# Patient Record
Sex: Male | Born: 1960
Health system: Southern US, Community
[De-identification: ages and names within clinical notes are randomized; demographics above are authoritative.]

## PROBLEM LIST (undated history)

## (undated) DIAGNOSIS — J302 Other seasonal allergic rhinitis: Secondary | ICD-10-CM

## (undated) DIAGNOSIS — I4891 Unspecified atrial fibrillation: Secondary | ICD-10-CM

## (undated) DIAGNOSIS — N289 Disorder of kidney and ureter, unspecified: Secondary | ICD-10-CM

## (undated) DIAGNOSIS — I429 Cardiomyopathy, unspecified: Secondary | ICD-10-CM

## (undated) DIAGNOSIS — L309 Dermatitis, unspecified: Secondary | ICD-10-CM

## (undated) HISTORY — DX: Unspecified atrial fibrillation: I48.91

## (undated) HISTORY — DX: Other seasonal allergic rhinitis: J30.2

## (undated) HISTORY — DX: Cardiomyopathy, unspecified: I42.9

## (undated) HISTORY — PX: CATARACT EXTRACTION: SUR2

## (undated) HISTORY — DX: Dermatitis, unspecified: L30.9

---

## 2001-03-15 ENCOUNTER — Emergency Department (HOSPITAL_COMMUNITY): Admission: EM | Admit: 2001-03-15 | Discharge: 2001-03-15 | Payer: Self-pay

## 2002-02-13 ENCOUNTER — Ambulatory Visit (HOSPITAL_BASED_OUTPATIENT_CLINIC_OR_DEPARTMENT_OTHER): Admission: RE | Admit: 2002-02-13 | Discharge: 2002-02-13 | Payer: Self-pay | Admitting: Otolaryngology

## 2002-12-31 ENCOUNTER — Ambulatory Visit (HOSPITAL_COMMUNITY): Admission: RE | Admit: 2002-12-31 | Discharge: 2002-12-31 | Payer: Self-pay | Admitting: Gastroenterology

## 2005-08-21 ENCOUNTER — Encounter: Admission: RE | Admit: 2005-08-21 | Discharge: 2005-11-19 | Payer: Self-pay | Admitting: Otolaryngology

## 2006-01-28 ENCOUNTER — Encounter: Admission: RE | Admit: 2006-01-28 | Discharge: 2006-01-28 | Payer: Self-pay | Admitting: Gastroenterology

## 2006-02-21 ENCOUNTER — Encounter: Admission: RE | Admit: 2006-02-21 | Discharge: 2006-02-21 | Payer: Self-pay | Admitting: Neurology

## 2008-05-25 ENCOUNTER — Encounter: Admission: RE | Admit: 2008-05-25 | Discharge: 2008-05-25 | Payer: Self-pay | Admitting: Gastroenterology

## 2008-05-31 ENCOUNTER — Encounter: Admission: RE | Admit: 2008-05-31 | Discharge: 2008-05-31 | Payer: Self-pay | Admitting: Gastroenterology

## 2008-11-15 ENCOUNTER — Encounter: Admission: RE | Admit: 2008-11-15 | Discharge: 2008-11-15 | Payer: Self-pay | Admitting: Gastroenterology

## 2008-12-25 ENCOUNTER — Ambulatory Visit (HOSPITAL_COMMUNITY): Admission: RE | Admit: 2008-12-25 | Discharge: 2008-12-25 | Payer: Self-pay | Admitting: Urology

## 2009-06-12 ENCOUNTER — Ambulatory Visit (HOSPITAL_COMMUNITY): Admission: RE | Admit: 2009-06-12 | Discharge: 2009-06-12 | Payer: Self-pay | Admitting: Urology

## 2009-11-28 ENCOUNTER — Ambulatory Visit (HOSPITAL_COMMUNITY): Admission: RE | Admit: 2009-11-28 | Discharge: 2009-11-28 | Payer: Self-pay | Admitting: Urology

## 2010-08-21 ENCOUNTER — Ambulatory Visit: Payer: Self-pay | Admitting: Ophthalmology

## 2010-10-05 NOTE — Op Note (Signed)
   NAME:  Dylan Wilkerson, Dylan Wilkerson                       ACCOUNT NO.:  [id number]   MEDICAL RECORD NO.:  [id number]                   PATIENT TYPE:  AMB   LOCATION:  ENDO                                 FACILITY:  [facility]   PHYSICIAN:  Danise Edge, M.D.                DATE OF BIRTH:  Jan 19, 1961   DATE OF PROCEDURE:  12/31/2002  DATE OF DISCHARGE:                                 OPERATIVE REPORT   PROCEDURE:  Screening colonoscopy.   INDICATIONS:  [name] is a 50 year old male, born March 15, 1961.  [name] 37 year old mother underwent a colonoscopy at [facility] and a neoplastic polyp was removed.  He has a grandparent  diagnosed with colon cancer years ago.   ENDOSCOPIST:  Danise Edge, M.D.   PREMEDICATION:  Demerol 100 mg, Versed 8 mg.   DESCRIPTION OF PROCEDURE:  After obtaining informed consent, [name]  was placed in the left lateral decubitus position.  I administered  intravenous Demerol and intravenous Versed to achieve conscious sedation for  the procedure.  The patient's blood pressure, oxygen saturation and cardiac  rhythm were monitored throughout the procedure and documented in the medical  record.   Anal inspection was normal.  Digital rectal exam was normal.  The prostate  was nonnodular and small.  The Olympus adult colonoscope was introduced into  the rectum and advanced to the cecum.  Colonic preparation for the exam  today was excellent.  Rectum:  Normal.  Sigmoid colon and descending colon:  Normal.  Splenic flexure:  Normal.  Transverse colon:  Normal.  Hepatic flexure:  Normal.  Ascending colon:  Normal.  Cecum and ileocecal valve:  Normal.   ASSESSMENT:  Normal screening proctocolonoscopy to the cecum.  No endoscopic  evidence for the presence of colorectal neoplasia.   RECOMMENDATIONS:  Repeat colonoscopy in 10 years.                                               Danise Edge, M.D.    MJ/MEDQ  D:  12/31/2002   T:  01/01/2003  Job:  [id number]

## 2010-12-03 ENCOUNTER — Other Ambulatory Visit (HOSPITAL_COMMUNITY): Payer: Self-pay | Admitting: Urology

## 2010-12-03 DIAGNOSIS — D499 Neoplasm of unspecified behavior of unspecified site: Secondary | ICD-10-CM

## 2011-01-11 ENCOUNTER — Ambulatory Visit (HOSPITAL_COMMUNITY)
Admission: RE | Admit: 2011-01-11 | Discharge: 2011-01-11 | Disposition: A | Discharge status: Home / Self Care | Payer: 59 | Source: Ambulatory Visit | Attending: Urology | Admitting: Urology

## 2011-01-11 DIAGNOSIS — N289 Disorder of kidney and ureter, unspecified: Secondary | ICD-10-CM | POA: Insufficient documentation

## 2011-01-11 DIAGNOSIS — D499 Neoplasm of unspecified behavior of unspecified site: Secondary | ICD-10-CM

## 2011-01-11 DIAGNOSIS — N281 Cyst of kidney, acquired: Secondary | ICD-10-CM | POA: Insufficient documentation

## 2011-01-11 MED ORDER — GADOBENATE DIMEGLUMINE 529 MG/ML IV SOLN
17.0000 mL | Freq: Once | INTRAVENOUS | Status: AC | PRN
  Administered 2011-01-11: 17 mL via INTRAVENOUS

## 2011-04-19 ENCOUNTER — Ambulatory Visit: Payer: Self-pay | Admitting: Ophthalmology

## 2012-01-01 ENCOUNTER — Other Ambulatory Visit (HOSPITAL_COMMUNITY): Payer: Self-pay | Admitting: Urology

## 2012-01-01 DIAGNOSIS — D49519 Neoplasm of unspecified behavior of unspecified kidney: Secondary | ICD-10-CM

## 2012-01-22 ENCOUNTER — Ambulatory Visit (HOSPITAL_COMMUNITY)
Admission: RE | Admit: 2012-01-22 | Discharge: 2012-01-22 | Disposition: A | Discharge status: Home / Self Care | Payer: 59 | Source: Ambulatory Visit | Attending: Urology | Admitting: Urology

## 2012-01-22 DIAGNOSIS — D4959 Neoplasm of unspecified behavior of other genitourinary organ: Secondary | ICD-10-CM | POA: Insufficient documentation

## 2012-01-22 DIAGNOSIS — D49519 Neoplasm of unspecified behavior of unspecified kidney: Secondary | ICD-10-CM

## 2012-01-22 DIAGNOSIS — N289 Disorder of kidney and ureter, unspecified: Secondary | ICD-10-CM | POA: Insufficient documentation

## 2012-01-22 MED ORDER — GADOBENATE DIMEGLUMINE 529 MG/ML IV SOLN
17.0000 mL | Freq: Once | INTRAVENOUS | Status: AC | PRN
  Administered 2012-01-22: 17 mL via INTRAVENOUS

## 2014-01-12 ENCOUNTER — Other Ambulatory Visit: Payer: Self-pay | Admitting: Urology

## 2014-01-12 DIAGNOSIS — D49519 Neoplasm of unspecified behavior of unspecified kidney: Secondary | ICD-10-CM

## 2014-01-28 ENCOUNTER — Ambulatory Visit (HOSPITAL_COMMUNITY)
Admission: RE | Admit: 2014-01-28 | Discharge: 2014-01-28 | Disposition: A | Discharge status: Home / Self Care | Payer: 59 | Source: Ambulatory Visit | Attending: Urology | Admitting: Urology

## 2014-01-28 ENCOUNTER — Other Ambulatory Visit (HOSPITAL_COMMUNITY): Payer: Self-pay | Admitting: Urology

## 2014-01-28 DIAGNOSIS — D4959 Neoplasm of unspecified behavior of other genitourinary organ: Secondary | ICD-10-CM | POA: Insufficient documentation

## 2014-01-28 DIAGNOSIS — D49519 Neoplasm of unspecified behavior of unspecified kidney: Secondary | ICD-10-CM

## 2014-01-28 MED ORDER — GADOBENATE DIMEGLUMINE 529 MG/ML IV SOLN
20.0000 mL | Freq: Once | INTRAVENOUS | Status: AC | PRN
  Administered 2014-01-28: 20 mL via INTRAVENOUS

## 2014-05-02 ENCOUNTER — Other Ambulatory Visit: Payer: Self-pay | Admitting: Gastroenterology

## 2014-05-02 DIAGNOSIS — R1012 Left upper quadrant pain: Secondary | ICD-10-CM

## 2014-05-04 ENCOUNTER — Ambulatory Visit
Admission: RE | Admit: 2014-05-04 | Discharge: 2014-05-04 | Disposition: A | Discharge status: Home / Self Care | Payer: 59 | Source: Ambulatory Visit | Attending: Gastroenterology | Admitting: Gastroenterology

## 2014-05-04 DIAGNOSIS — R1012 Left upper quadrant pain: Secondary | ICD-10-CM

## 2014-05-05 ENCOUNTER — Ambulatory Visit
Admission: RE | Admit: 2014-05-05 | Discharge: 2014-05-05 | Disposition: A | Discharge status: Home / Self Care | Payer: 59 | Source: Ambulatory Visit | Attending: Gastroenterology | Admitting: Gastroenterology

## 2014-05-05 ENCOUNTER — Other Ambulatory Visit: Payer: Self-pay | Admitting: Gastroenterology

## 2014-05-05 DIAGNOSIS — R0789 Other chest pain: Secondary | ICD-10-CM

## 2015-03-05 ENCOUNTER — Emergency Department (HOSPITAL_COMMUNITY)
Admission: EM | Admit: 2015-03-05 | Discharge: 2015-03-05 | Disposition: A | Discharge status: Home / Self Care | Payer: 59 | Attending: Emergency Medicine | Admitting: Emergency Medicine

## 2015-03-05 ENCOUNTER — Emergency Department (HOSPITAL_COMMUNITY): Payer: 59

## 2015-03-05 ENCOUNTER — Encounter (HOSPITAL_COMMUNITY): Payer: Self-pay | Admitting: Emergency Medicine

## 2015-03-05 DIAGNOSIS — X58XXXA Exposure to other specified factors, initial encounter: Secondary | ICD-10-CM | POA: Insufficient documentation

## 2015-03-05 DIAGNOSIS — S4992XA Unspecified injury of left shoulder and upper arm, initial encounter: Secondary | ICD-10-CM | POA: Diagnosis present

## 2015-03-05 DIAGNOSIS — Y9289 Other specified places as the place of occurrence of the external cause: Secondary | ICD-10-CM | POA: Insufficient documentation

## 2015-03-05 DIAGNOSIS — Y9389 Activity, other specified: Secondary | ICD-10-CM | POA: Diagnosis not present

## 2015-03-05 DIAGNOSIS — S46011A Strain of muscle(s) and tendon(s) of the rotator cuff of right shoulder, initial encounter: Secondary | ICD-10-CM | POA: Diagnosis not present

## 2015-03-05 DIAGNOSIS — T148XXA Other injury of unspecified body region, initial encounter: Secondary | ICD-10-CM

## 2015-03-05 DIAGNOSIS — Z87448 Personal history of other diseases of urinary system: Secondary | ICD-10-CM | POA: Insufficient documentation

## 2015-03-05 DIAGNOSIS — S299XXA Unspecified injury of thorax, initial encounter: Secondary | ICD-10-CM | POA: Diagnosis not present

## 2015-03-05 DIAGNOSIS — Y998 Other external cause status: Secondary | ICD-10-CM | POA: Diagnosis not present

## 2015-03-05 HISTORY — DX: Disorder of kidney and ureter, unspecified: N28.9

## 2015-03-05 MED ORDER — HYDROCODONE-ACETAMINOPHEN 5-325 MG PO TABS
1.0000 | ORAL_TABLET | Freq: Once | ORAL | Status: AC
  Administered 2015-03-05: 1 via ORAL
  Filled 2015-03-05: qty 1

## 2015-03-05 MED ORDER — DIAZEPAM 5 MG PO TABS: 5.0000 mg | ORAL_TABLET | Freq: Three times a day (TID) | ORAL | Status: DC | PRN

## 2015-03-05 MED ORDER — HYDROCODONE-ACETAMINOPHEN 5-325 MG PO TABS: 1.0000 | ORAL_TABLET | Freq: Four times a day (QID) | ORAL | Status: DC | PRN

## 2015-03-05 MED ORDER — DIAZEPAM 5 MG PO TABS
5.0000 mg | ORAL_TABLET | Freq: Once | ORAL | Status: AC
  Administered 2015-03-05: 5 mg via ORAL
  Filled 2015-03-05: qty 1

## 2015-03-05 NOTE — ED Notes (Signed)
Per EMS pt has had increasing shoulder pain that has increased to the point he was unable to sleep tonight.  Pain while still is a 2 however upon movement 10.

## 2015-03-05 NOTE — ED Provider Notes (Signed)
CSN: [id number]     Arrival date & time    History  By signing my name below, I, Terrance Branch, attest that this documentation has been prepared under the direction and in the presence of No att. providers found. Electronically Signed: Randa Evens, ED Scribe. 03/05/2015. 7:35 AM.     Chief Complaint  Patient presents with  . Shoulder Pain   The history is provided by the patient. No language interpreter was used.   HPI Comments: Dylan Wilkerson is a 54 y.o. male brought in by ambulance, who presents to the Emergency Department complaining of worsening sharp-stabbing back pain onset 3 days prior. Pt states that the pain was so severe tonight that he could not sleep. Pt states that the pain begins in his right upper back and radiates down to his right hip.  Pt states that breathing, movement and laying down makes the pain worse. Pt states that turning his head to the left makes the pain worse as well. Pt denies any medications PTA. Pt denies any heavy lifting or injury to the back  Pt does he does have HX of 2 right kidney lesions. Pt denies rash, CP, abdominal pain or other related symptoms.    Past Medical History  Diagnosis Date  . Kidney lesion     "has been cleared"   History reviewed. No pertinent past surgical history. No family history on file. Social History  Substance Use Topics  . Smoking status: Never Smoker   . Smokeless tobacco: None  . Alcohol Use: No    Review of Systems  Cardiovascular: Negative for chest pain.  Gastrointestinal: Negative for abdominal pain.  Genitourinary: Negative.   Musculoskeletal: Positive for myalgias and back pain.  Skin: Negative for rash.  All other systems reviewed and are negative.     Allergies  Dairy aid  Home Medications   Prior to Admission medications   Medication Sig Start Date End Date Taking? Authorizing Provider  diazepam (VALIUM) 5 MG tablet Take 1 tablet (5 mg total) by mouth every 8 (eight) hours as needed  for muscle spasms. 03/05/15   Merrily Pew, MD  HYDROcodone-acetaminophen (NORCO/VICODIN) 5-325 MG tablet Take 1 tablet by mouth every 6 (six) hours as needed for moderate pain. 03/05/15   Philomene Haff, MD   BP 134/85 mmHg  Pulse 79  Temp(Src) 98.3 F (36.8 C) (Oral)  Resp 18  SpO2 97%   Physical Exam  Constitutional: He is oriented to person, place, and time. He appears well-developed and well-nourished. No distress.  HENT:  Head: Normocephalic and atraumatic.  Eyes: Conjunctivae and EOM are normal.  Neck: Neck supple. No tracheal deviation present.  Cardiovascular: Normal rate, regular rhythm and normal heart sounds.  Exam reveals no gallop.   No murmur heard. Pulmonary/Chest: Effort normal and breath sounds normal. No respiratory distress. He has no wheezes. He has no rales.  Abdominal: Soft. There is no tenderness.  Musculoskeletal: Normal range of motion. He exhibits tenderness.  Right trapezius tender to palpation. Movement of right trapezius exacerbates pain.   Neurological: He is alert and oriented to person, place, and time. No cranial nerve deficit.  Skin: Skin is warm and dry.  Psychiatric: He has a normal mood and affect. His behavior is normal.  Nursing note and vitals reviewed.   ED Course  Procedures (including critical care time) DIAGNOSTIC STUDIES: Oxygen Saturation is 100% on RA, normal by my interpretation.    COORDINATION OF CARE: 2:48 AM-Discussed treatment plan with pt at  bedside and pt agreed to plan.     Labs Review Labs Reviewed - No data to display  Imaging Review Dg Chest 2 View  03/05/2015  CLINICAL DATA:  Initial valuation for acute back pain for 3 days. EXAM: CHEST  2 VIEW COMPARISON:  Prior radiograph from 05/05/2014. FINDINGS: The cardiac and mediastinal silhouettes are stable in size and contour, and remain within normal limits. The lungs are normally inflated. No airspace consolidation, pleural effusion, or pulmonary edema is identified.  There is no pneumothorax. No acute osseous abnormality identified. IMPRESSION: No active cardiopulmonary disease. Electronically Signed   By: Jeannine Boga M.D.   On: 03/05/2015 03:54      EKG Interpretation None      MDM   Final diagnoses:  Muscle strain   Here with likely previous strain and spasm improved with symptomatic treatment in the ED, will dc on same. Doubt vertebral dissection or pneumonia.    I have personally and contemperaneously reviewed labs and imaging and used in my decision making as above.   A medical screening exam was performed and I feel the patient has had an appropriate workup for their chief complaint at this time and likelihood of emergent condition existing is low. They have been counseled on decision, discharge, follow up and which symptoms necessitate immediate return to the emergency department. They or their family verbally stated understanding and agreement with plan and discharged in stable condition.   I personally performed the services described in this documentation, which was scribed in my presence. The recorded information has been reviewed and is accurate.      Merrily Pew, MD 03/05/15 [phone]

## 2015-03-05 NOTE — ED Notes (Signed)
Pt placed in a gown and hooked up to the monitor with the BP cuff and pulse ox 

## 2015-03-05 NOTE — ED Notes (Signed)
Discharge instructions and prescriptions reviewed - voiced understanding 

## 2016-05-27 DIAGNOSIS — J329 Chronic sinusitis, unspecified: Secondary | ICD-10-CM | POA: Diagnosis not present

## 2016-08-14 DIAGNOSIS — H401132 Primary open-angle glaucoma, bilateral, moderate stage: Secondary | ICD-10-CM | POA: Diagnosis not present

## 2016-08-14 DIAGNOSIS — H5213 Myopia, bilateral: Secondary | ICD-10-CM | POA: Diagnosis not present

## 2016-08-14 DIAGNOSIS — Z961 Presence of intraocular lens: Secondary | ICD-10-CM | POA: Diagnosis not present

## 2016-08-28 DIAGNOSIS — H15833 Staphyloma posticum, bilateral: Secondary | ICD-10-CM | POA: Diagnosis not present

## 2016-08-28 DIAGNOSIS — H442A3 Degenerative myopia with choroidal neovascularization, bilateral eye: Secondary | ICD-10-CM | POA: Diagnosis not present

## 2016-12-18 DIAGNOSIS — S20451A Superficial foreign body of right back wall of thorax, initial encounter: Secondary | ICD-10-CM | POA: Diagnosis not present

## 2016-12-18 DIAGNOSIS — S2096XA Insect bite (nonvenomous) of unspecified parts of thorax, initial encounter: Secondary | ICD-10-CM | POA: Diagnosis not present

## 2017-01-29 DIAGNOSIS — H401132 Primary open-angle glaucoma, bilateral, moderate stage: Secondary | ICD-10-CM | POA: Diagnosis not present

## 2017-03-18 DIAGNOSIS — Z0000 Encounter for general adult medical examination without abnormal findings: Secondary | ICD-10-CM | POA: Diagnosis not present

## 2017-03-18 DIAGNOSIS — Z23 Encounter for immunization: Secondary | ICD-10-CM | POA: Diagnosis not present

## 2017-03-18 DIAGNOSIS — N281 Cyst of kidney, acquired: Secondary | ICD-10-CM | POA: Diagnosis not present

## 2017-03-18 DIAGNOSIS — Z125 Encounter for screening for malignant neoplasm of prostate: Secondary | ICD-10-CM | POA: Diagnosis not present

## 2017-03-18 DIAGNOSIS — E7800 Pure hypercholesterolemia, unspecified: Secondary | ICD-10-CM | POA: Diagnosis not present

## 2017-06-27 DIAGNOSIS — M25522 Pain in left elbow: Secondary | ICD-10-CM | POA: Diagnosis not present

## 2017-07-29 DIAGNOSIS — H401132 Primary open-angle glaucoma, bilateral, moderate stage: Secondary | ICD-10-CM | POA: Diagnosis not present

## 2017-07-29 DIAGNOSIS — Z961 Presence of intraocular lens: Secondary | ICD-10-CM | POA: Diagnosis not present

## 2017-07-29 DIAGNOSIS — H5213 Myopia, bilateral: Secondary | ICD-10-CM | POA: Diagnosis not present

## 2017-08-16 DIAGNOSIS — M25522 Pain in left elbow: Secondary | ICD-10-CM | POA: Diagnosis not present

## 2017-08-16 DIAGNOSIS — M7712 Lateral epicondylitis, left elbow: Secondary | ICD-10-CM | POA: Diagnosis not present

## 2017-08-21 DIAGNOSIS — M25522 Pain in left elbow: Secondary | ICD-10-CM | POA: Diagnosis not present

## 2017-08-28 DIAGNOSIS — M25522 Pain in left elbow: Secondary | ICD-10-CM | POA: Diagnosis not present

## 2017-09-03 DIAGNOSIS — H15833 Staphyloma posticum, bilateral: Secondary | ICD-10-CM | POA: Diagnosis not present

## 2017-09-03 DIAGNOSIS — H4423 Degenerative myopia, bilateral: Secondary | ICD-10-CM | POA: Diagnosis not present

## 2017-09-03 DIAGNOSIS — H43811 Vitreous degeneration, right eye: Secondary | ICD-10-CM | POA: Diagnosis not present

## 2017-09-04 DIAGNOSIS — M25522 Pain in left elbow: Secondary | ICD-10-CM | POA: Diagnosis not present

## 2017-09-18 DIAGNOSIS — M25522 Pain in left elbow: Secondary | ICD-10-CM | POA: Diagnosis not present

## 2017-09-26 DIAGNOSIS — M25522 Pain in left elbow: Secondary | ICD-10-CM | POA: Diagnosis not present

## 2017-10-08 DIAGNOSIS — M25522 Pain in left elbow: Secondary | ICD-10-CM | POA: Diagnosis not present

## 2017-11-17 DIAGNOSIS — H6692 Otitis media, unspecified, left ear: Secondary | ICD-10-CM | POA: Diagnosis not present

## 2017-11-22 DIAGNOSIS — H6980 Other specified disorders of Eustachian tube, unspecified ear: Secondary | ICD-10-CM | POA: Diagnosis not present

## 2017-12-02 DIAGNOSIS — H6521 Chronic serous otitis media, right ear: Secondary | ICD-10-CM | POA: Diagnosis not present

## 2017-12-02 DIAGNOSIS — H6061 Unspecified chronic otitis externa, right ear: Secondary | ICD-10-CM | POA: Diagnosis not present

## 2017-12-02 DIAGNOSIS — H6691 Otitis media, unspecified, right ear: Secondary | ICD-10-CM | POA: Diagnosis not present

## 2017-12-05 DIAGNOSIS — H6061 Unspecified chronic otitis externa, right ear: Secondary | ICD-10-CM | POA: Diagnosis not present

## 2017-12-05 DIAGNOSIS — H6691 Otitis media, unspecified, right ear: Secondary | ICD-10-CM | POA: Diagnosis not present

## 2017-12-05 DIAGNOSIS — H9311 Tinnitus, right ear: Secondary | ICD-10-CM | POA: Diagnosis not present

## 2017-12-19 DIAGNOSIS — H6121 Impacted cerumen, right ear: Secondary | ICD-10-CM | POA: Diagnosis not present

## 2017-12-19 DIAGNOSIS — H6061 Unspecified chronic otitis externa, right ear: Secondary | ICD-10-CM | POA: Diagnosis not present

## 2017-12-22 DIAGNOSIS — S90222A Contusion of left lesser toe(s) with damage to nail, initial encounter: Secondary | ICD-10-CM | POA: Diagnosis not present

## 2018-01-02 DIAGNOSIS — H6121 Impacted cerumen, right ear: Secondary | ICD-10-CM | POA: Diagnosis not present

## 2018-01-02 DIAGNOSIS — H6061 Unspecified chronic otitis externa, right ear: Secondary | ICD-10-CM | POA: Diagnosis not present

## 2018-01-23 DIAGNOSIS — Z23 Encounter for immunization: Secondary | ICD-10-CM | POA: Diagnosis not present

## 2018-01-27 DIAGNOSIS — H401131 Primary open-angle glaucoma, bilateral, mild stage: Secondary | ICD-10-CM | POA: Diagnosis not present

## 2018-04-02 DIAGNOSIS — M25561 Pain in right knee: Secondary | ICD-10-CM | POA: Insufficient documentation

## 2018-04-06 ENCOUNTER — Other Ambulatory Visit: Payer: Self-pay | Admitting: Internal Medicine

## 2018-04-06 DIAGNOSIS — N281 Cyst of kidney, acquired: Secondary | ICD-10-CM

## 2018-04-06 DIAGNOSIS — R109 Unspecified abdominal pain: Secondary | ICD-10-CM | POA: Diagnosis not present

## 2018-04-06 DIAGNOSIS — E7800 Pure hypercholesterolemia, unspecified: Secondary | ICD-10-CM | POA: Diagnosis not present

## 2018-04-06 DIAGNOSIS — Z8042 Family history of malignant neoplasm of prostate: Secondary | ICD-10-CM | POA: Diagnosis not present

## 2018-04-06 DIAGNOSIS — Z0000 Encounter for general adult medical examination without abnormal findings: Secondary | ICD-10-CM | POA: Diagnosis not present

## 2018-04-08 DIAGNOSIS — L309 Dermatitis, unspecified: Secondary | ICD-10-CM | POA: Diagnosis not present

## 2018-04-09 ENCOUNTER — Ambulatory Visit
Admission: RE | Admit: 2018-04-09 | Discharge: 2018-04-09 | Disposition: A | Discharge status: Home / Self Care | Payer: 59 | Source: Ambulatory Visit | Attending: Internal Medicine | Admitting: Internal Medicine

## 2018-04-09 DIAGNOSIS — N281 Cyst of kidney, acquired: Secondary | ICD-10-CM | POA: Diagnosis not present

## 2018-05-21 DIAGNOSIS — J40 Bronchitis, not specified as acute or chronic: Secondary | ICD-10-CM | POA: Diagnosis not present

## 2018-05-21 DIAGNOSIS — R030 Elevated blood-pressure reading, without diagnosis of hypertension: Secondary | ICD-10-CM | POA: Diagnosis not present

## 2018-06-14 DIAGNOSIS — J019 Acute sinusitis, unspecified: Secondary | ICD-10-CM | POA: Diagnosis not present

## 2018-06-14 DIAGNOSIS — R05 Cough: Secondary | ICD-10-CM | POA: Diagnosis not present

## 2018-07-27 DIAGNOSIS — M25561 Pain in right knee: Secondary | ICD-10-CM | POA: Diagnosis not present

## 2018-07-29 DIAGNOSIS — H5213 Myopia, bilateral: Secondary | ICD-10-CM | POA: Diagnosis not present

## 2018-07-29 DIAGNOSIS — H401131 Primary open-angle glaucoma, bilateral, mild stage: Secondary | ICD-10-CM | POA: Diagnosis not present

## 2018-07-29 DIAGNOSIS — Z961 Presence of intraocular lens: Secondary | ICD-10-CM | POA: Diagnosis not present

## 2018-08-04 DIAGNOSIS — M25561 Pain in right knee: Secondary | ICD-10-CM | POA: Diagnosis not present

## 2018-08-06 DIAGNOSIS — J301 Allergic rhinitis due to pollen: Secondary | ICD-10-CM | POA: Diagnosis not present

## 2018-08-12 DIAGNOSIS — J305 Allergic rhinitis due to food: Secondary | ICD-10-CM | POA: Diagnosis not present

## 2018-08-12 DIAGNOSIS — J301 Allergic rhinitis due to pollen: Secondary | ICD-10-CM | POA: Diagnosis not present

## 2018-08-12 DIAGNOSIS — J302 Other seasonal allergic rhinitis: Secondary | ICD-10-CM | POA: Diagnosis not present

## 2018-08-17 DIAGNOSIS — M25561 Pain in right knee: Secondary | ICD-10-CM | POA: Diagnosis not present

## 2018-09-21 DIAGNOSIS — H10412 Chronic giant papillary conjunctivitis, left eye: Secondary | ICD-10-CM | POA: Diagnosis not present

## 2018-09-28 DIAGNOSIS — H10412 Chronic giant papillary conjunctivitis, left eye: Secondary | ICD-10-CM | POA: Diagnosis not present

## 2019-05-05 ENCOUNTER — Encounter: Payer: Self-pay | Admitting: Cardiology

## 2019-05-05 ENCOUNTER — Other Ambulatory Visit: Payer: Self-pay

## 2019-05-05 ENCOUNTER — Ambulatory Visit (INDEPENDENT_AMBULATORY_CARE_PROVIDER_SITE_OTHER): Payer: 59 | Admitting: Cardiology

## 2019-05-05 VITALS — BP 132/90 | HR 73 | Temp 96.5°F | Ht 72.0 in | Wt 211.0 lb

## 2019-05-05 DIAGNOSIS — I4891 Unspecified atrial fibrillation: Secondary | ICD-10-CM

## 2019-05-05 DIAGNOSIS — I480 Paroxysmal atrial fibrillation: Secondary | ICD-10-CM | POA: Insufficient documentation

## 2019-05-05 NOTE — Progress Notes (Signed)
Patient referred by Josetta Huddle, MD for atrial fibrillation  Subjective:   Dylan Wilkerson, male    DOB: July 17, 1960, 58 y.o.   MRN: [id number]   Chief Complaint  Patient presents with  . New Patient (Initial Visit)  . Atrial Fibrillation     HPI  58 y.o. Caucsian male with new diagnosis of atrial fibrillation.  Patient has no prior cardiac history.  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  His prior medical history includes early cataract, retinal detachment, for which he sees Dr. Luberta Mutter and Dr. Sherlynn Stalls.  Patient recently saw Dr. Inda Merlin for his annual physical exam, his then he was incidentally found to have atrial fibrillation.  He walks for about 2 miles every day.  He denies any symptoms of chest pain, shortness of breath, palpitations.  Since the diagnosis of atrial fibrillation, he has reduced his caffeine intake from 5 to 6 cups to 1-2 cups.  Blood pressure mildly elevated today, but is historically normal.  He does not have diabetes, history of prior strokes, known vascular disease.  He denies snoring at night.  He is reportedly had sleep study prior, and was normal.  Past Medical History:  Diagnosis Date  . Kidney lesion    "has been cleared"     Past Surgical History:  Procedure Laterality Date  . CATARACT EXTRACTION Bilateral    age 58 right eye, age 74 lft eye     Social History   Tobacco Use  Smoking Status Never Smoker  Smokeless Tobacco Never Used    Social History   Substance and Sexual Activity  Alcohol Use No    Family History  Problem Relation Age of Onset  . Dementia Father   . Prostate cancer Father      Current Outpatient Medications on File Prior to Visit  Medication Sig Dispense Refill  . Bioflavonoid Products (ESTER-C) TABS Take 1 tablet by mouth daily. 108m    . brimonidine-timolol (COMBIGAN) 0.2-0.5 % ophthalmic solution 2 (two) times daily.    . Cholecalciferol  (VITAMIN D3) 125 MCG (5000 UT) TABS Take by mouth daily.    . diphenhydrAMINE (BENADRYL) 25 MG tablet Take by mouth as needed.    . loratadine-pseudoephedrine (CLARITIN-D 12-HOUR) 5-120 MG tablet Take by mouth daily.    . Misc Natural Products (GLUCOSAMINE CHOND MSM FORMULA) TABS Take by mouth daily.    . Multiple Vitamins-Minerals (MULTIVITAMIN MEN 50+ PO) Take by mouth daily.    . Multiple Vitamins-Minerals (PRESERVISION AREDS 2 PO) Take by mouth. 2 tabs qd    . Saw Palmetto 450 MG CAPS Take by mouth daily.    .Marland Kitchentriamcinolone cream (KENALOG) 0.1 % as needed. Hand for eczema    . zinc gluconate 50 MG tablet Take 50 mg by mouth daily.     No current facility-administered medications on file prior to visit.    Cardiovascular and other pertinent studies:  EKG 05/05/2019: Atrial fibrillation, controlled ventricular rate 94 bpm. Occasional ectopic ventricular beat     Recent labs: 04/19/2019: Glucose 83, BUN/Cr 13/0.78. EGFR normal. Na/K 140/4.2. Rest of the CMP normal H/H 15/43. MCV 91. Platelets 203    Review of Systems  Constitution: Negative for decreased appetite, malaise/fatigue, weight gain and weight loss.  HENT: Negative for congestion.   Eyes: Negative for visual disturbance.  Cardiovascular: Negative for chest pain, dyspnea on exertion, leg swelling, palpitations and syncope.  Respiratory: Negative for cough.   Endocrine: Negative  for cold intolerance.  Hematologic/Lymphatic: Does not bruise/bleed easily.  Skin: Negative for itching and rash.  Musculoskeletal: Negative for myalgias.  Gastrointestinal: Negative for abdominal pain, nausea and vomiting.  Genitourinary: Negative for dysuria.  Neurological: Negative for dizziness and weakness.  Psychiatric/Behavioral: The patient is not nervous/anxious.   All other systems reviewed and are negative.        Vitals:   05/05/19 1120  BP: (!) 148/99  Pulse: 93  Temp: (!) 96.5 F (35.8 C)  SpO2: 100%     Body mass  index is 28.62 kg/m. Filed Weights   05/05/19 1120  Weight: 211 lb (95.7 kg)     Objective:   Physical Exam  Constitutional: He is oriented to person, place, and time. He appears well-developed and well-nourished. No distress.  HENT:  Head: Normocephalic and atraumatic.  Eyes: Pupils are equal, round, and reactive to light. Conjunctivae are normal.  Neck: No JVD present.  Cardiovascular: Normal rate and intact distal pulses. An irregularly irregular rhythm present.  Pulmonary/Chest: Effort normal and breath sounds normal. He has no wheezes. He has no rales.  Abdominal: Soft. Bowel sounds are normal. There is no rebound.  Musculoskeletal:        General: No edema.  Lymphadenopathy:    He has no cervical adenopathy.  Neurological: He is alert and oriented to person, place, and time. No cranial nerve deficit.  Skin: Skin is warm and dry.  Psychiatric: He has a normal mood and affect.  Nursing note and vitals reviewed.       Assessment & Recommendations:   58 y.o. Caucsian male with new diagnosis of atrial fibrillation.  Atrial fibrillation: Incidental finding.  Rate well controlled.  No significant symptoms. I discussed risks/benefits of rate versus rhythm control, anticoagulation versus not with the patient. While he is asymptomatic and rate is controlled, he is young and otherwise relatively healthy.  I do think he would benefit from at least one attempt of cardioversion.  His CHA2DS2VASc score is at the most 1, encounter for hypertension.  This puts him at <1% annual stroke risk.  While he would not need long-term anticoagulation, I would recommend anticoagulation 4 weeks before and after cardioversion.  Given patient's history of early cataract and retinal detachment, I will seek advice from his ophthalmologist Dr. Carin Primrose and retina specialist Dr. Sherlynn Stalls, to make sure cardioversion and anticoagulation will not lose any additional risk from ophthalmic standpoint.  If  okay by then, we will proceed with cardioversion in late January 2021.  In the meantime, I will obtain echocardiogram.  After restoration of sinus rhythm, I will consider stress test to evaluate for ischemia as etiology.  Total time spent with patient was 45 minutes and greater than 50% of that time was spent in counseling and coordination care with the patient regarding complex decision making and discussion as state above.  Thank you for referring the patient to [place]. Please feel free to contact with any questions.  Nigel Mormon, MD [facility] [place] Pager: [phone] Office: [phone]

## 2019-05-12 ENCOUNTER — Other Ambulatory Visit: Payer: Self-pay | Admitting: Cardiology

## 2019-05-12 DIAGNOSIS — I4891 Unspecified atrial fibrillation: Secondary | ICD-10-CM

## 2019-05-19 ENCOUNTER — Other Ambulatory Visit: Payer: Self-pay

## 2019-05-19 ENCOUNTER — Ambulatory Visit (INDEPENDENT_AMBULATORY_CARE_PROVIDER_SITE_OTHER): Payer: 59

## 2019-05-19 DIAGNOSIS — I4891 Unspecified atrial fibrillation: Secondary | ICD-10-CM | POA: Diagnosis not present

## 2019-07-01 ENCOUNTER — Other Ambulatory Visit: Payer: Self-pay

## 2019-07-01 ENCOUNTER — Ambulatory Visit: Payer: 59 | Admitting: Cardiology

## 2019-07-01 ENCOUNTER — Encounter: Payer: Self-pay | Admitting: Cardiology

## 2019-07-01 VITALS — BP 125/95 | HR 97 | Temp 97.6°F | Ht 72.0 in | Wt 217.0 lb

## 2019-07-01 DIAGNOSIS — I4819 Other persistent atrial fibrillation: Secondary | ICD-10-CM

## 2019-07-01 DIAGNOSIS — I5020 Unspecified systolic (congestive) heart failure: Secondary | ICD-10-CM | POA: Diagnosis not present

## 2019-07-01 MED ORDER — ENTRESTO 24-26 MG PO TABS: 1.0000 | ORAL_TABLET | Freq: Two times a day (BID) | ORAL | 3 refills | Status: DC

## 2019-07-01 MED ORDER — APIXABAN 5 MG PO TABS: 5.0000 mg | ORAL_TABLET | Freq: Two times a day (BID) | ORAL | 3 refills | Status: DC

## 2019-07-01 NOTE — Progress Notes (Signed)
Patient referred by Josetta Huddle, MD for atrial fibrillation  Subjective:   Dylan Wilkerson, male    DOB: [birth year], 59 y.o.   MRN: [id number]   Chief Complaint  Patient presents with  . Atrial Fibrillation  . Follow-up  . Results    echo     HPI  59 y.o. Caucsian male with new diagnosis of atrial fibrillation, HFrEF.  Afib was new diagnosis in 04/2019. Echocardiogram showed reduced EF. In terms of symptoms, he has minimal exertional dyspnea, denies any chest pain. Heart rate is better controlled.    Initial consultation HPI 05/05/2019: Patient has no prior cardiac history.  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  His prior medical history includes early cataract, retinal detachment, for which he sees Dr. Luberta Mutter and Dr. Sherlynn Stalls.  Patient recently saw Dr. Inda Merlin for his annual physical exam, his then he was incidentally found to have atrial fibrillation.  He walks for about 2 miles every day.  He denies any symptoms of chest pain, shortness of breath, palpitations.  Since the diagnosis of atrial fibrillation, he has reduced his caffeine intake from 5 to 6 cups to 1-2 cups.  Blood pressure mildly elevated today, but is historically normal.  He does not have diabetes, history of prior strokes, known vascular disease.  He denies snoring at night.  He is reportedly had sleep study prior, and was normal.   Current Outpatient Medications on File Prior to Visit  Medication Sig Dispense Refill  . Bioflavonoid Products (ESTER-C) TABS Take 1 tablet by mouth daily. 1056m    . brimonidine-timolol (COMBIGAN) 0.2-0.5 % ophthalmic solution 2 (two) times daily.    . Cholecalciferol (VITAMIN D3) 125 MCG (5000 UT) TABS Take by mouth daily.    . diphenhydrAMINE (BENADRYL) 25 MG tablet Take by mouth as needed.    . loratadine-pseudoephedrine (CLARITIN-D 12-HOUR) 5-120 MG tablet Take by mouth daily.    . Misc Natural Products  (GLUCOSAMINE CHOND MSM FORMULA) TABS Take by mouth daily.    . Multiple Vitamins-Minerals (MULTIVITAMIN MEN 50+ PO) Take by mouth daily.    . Multiple Vitamins-Minerals (PRESERVISION AREDS 2 PO) Take by mouth. 2 tabs qd    . Saw Palmetto 450 MG CAPS Take by mouth daily.    .Marland Kitchentriamcinolone cream (KENALOG) 0.1 % as needed. Hand for eczema    . zinc gluconate 50 MG tablet Take 50 mg by mouth daily.     No current facility-administered medications on file prior to visit.    Cardiovascular and other pertinent studies:  EKG 07/01/2019: Atrial fibrillation 91 bpm   Echocardiogram 05/19/2019:  Left ventricle cavity is normal in size. Mild concentric hypertrophy of  the left ventricle. Moderate global hypokinesis with moderately depressed  LV systolic function with visual EF 35-40%. Unable to evaluate diastolic  function due to atrial fibrillation.  Left atrial cavity is severely dilated.  Trileaflet aortic valve. Moderate (Grade II) aortic regurgitation.  Moderate (Grade III) mitral regurgitation.  Mild tricuspid regurgitation.  No evidence of pulmonary hypertension.  EKG 05/05/2019: Atrial fibrillation, controlled ventricular rate 94 bpm. Occasional ectopic ventricular beat     Recent labs: 04/19/2019: Glucose 83, BUN/Cr 13/0.78. EGFR normal. Na/K 140/4.2. Rest of the CMP normal H/H 15/43. MCV 91. Platelets 203    Review of Systems  Cardiovascular: Negative for chest pain, dyspnea on exertion, leg swelling, palpitations and syncope.       Vitals:   07/01/19 1520  BP: (Marland Kitchen  125/95  Pulse: 97  Temp: 97.6 F (36.4 C)  SpO2: 98%     Body mass index is 29.43 kg/m. Filed Weights   07/01/19 1520  Weight: 217 lb (98.4 kg)     Objective:   Physical Exam  Constitutional: He appears well-developed and well-nourished.  Neck: No JVD present.  Cardiovascular: Normal rate, normal heart sounds and intact distal pulses. An irregularly irregular rhythm present.  No murmur  heard. Pulmonary/Chest: Effort normal and breath sounds normal. He has no wheezes. He has no rales.  Musculoskeletal:        General: No edema.  Nursing note and vitals reviewed.       Assessment & Recommendations:   59 y.o. Caucsian male with new diagnosis of atrial fibrillation, HFrEF.  Persistent atrial fibrillation: Minimally symptomatic. Rate is well controlled.  However, given reduced LVEF, recommend restoring sinus rhythm.  With new diagnosis of minimally symptomatic heart failure, his CHA2DS2-VASc score is 2, annual stroke risk 2.2%.  Recommend anticoagulation with Eliquis 5 mg twice daily. Recommend cardioversion in 4 weeks.  HFrEF: New diagnosis.  NYHA I-II symptoms with minimal exertional dyspnea. Moderate aortic and mitral regurgitation. I will start Entresto 24-26 mg twice daily.  Check BMP in 1 week. I am hopeful that this is arrhythmia induced cardiomyopathy, that may improve after restoring sinus rhythm.  Nigel Mormon, MD [facility] [place] Pager: [phone] Office: [phone]

## 2019-07-01 NOTE — H&P (View-Only) (Signed)
Patient referred by Josetta Huddle, MD for atrial fibrillation  Subjective:   Dylan Wilkerson, male    DOB: 02-Jul-1960, 59 y.o.   MRN: [id number]   Chief Complaint  Patient presents with  . Atrial Fibrillation  . Follow-up  . Results    echo     HPI  59 y.o. Caucsian male with new diagnosis of atrial fibrillation, HFrEF.  Afib was new diagnosis in 04/2019. Echocardiogram showed reduced EF. In terms of symptoms, he has minimal exertional dyspnea, denies any chest pain. Heart rate is better controlled.    Initial consultation HPI 05/05/2019: Patient has no prior cardiac history.  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  His prior medical history includes early cataract, retinal detachment, for which he sees Dr. Luberta Mutter and Dr. Sherlynn Stalls.  Patient recently saw Dr. Inda Merlin for his annual physical exam, his then he was incidentally found to have atrial fibrillation.  He walks for about 2 miles every day.  He denies any symptoms of chest pain, shortness of breath, palpitations.  Since the diagnosis of atrial fibrillation, he has reduced his caffeine intake from 5 to 6 cups to 1-2 cups.  Blood pressure mildly elevated today, but is historically normal.  He does not have diabetes, history of prior strokes, known vascular disease.  He denies snoring at night.  He is reportedly had sleep study prior, and was normal.   Current Outpatient Medications on File Prior to Visit  Medication Sig Dispense Refill  . Bioflavonoid Products (ESTER-C) TABS Take 1 tablet by mouth daily. 1020m    . brimonidine-timolol (COMBIGAN) 0.2-0.5 % ophthalmic solution 2 (two) times daily.    . Cholecalciferol (VITAMIN D3) 125 MCG (5000 UT) TABS Take by mouth daily.    . diphenhydrAMINE (BENADRYL) 25 MG tablet Take by mouth as needed.    . loratadine-pseudoephedrine (CLARITIN-D 12-HOUR) 5-120 MG tablet Take by mouth daily.    . Misc Natural Products  (GLUCOSAMINE CHOND MSM FORMULA) TABS Take by mouth daily.    . Multiple Vitamins-Minerals (MULTIVITAMIN MEN 50+ PO) Take by mouth daily.    . Multiple Vitamins-Minerals (PRESERVISION AREDS 2 PO) Take by mouth. 2 tabs qd    . Saw Palmetto 450 MG CAPS Take by mouth daily.    .Marland Kitchentriamcinolone cream (KENALOG) 0.1 % as needed. Hand for eczema    . zinc gluconate 50 MG tablet Take 50 mg by mouth daily.     No current facility-administered medications on file prior to visit.    Cardiovascular and other pertinent studies:  EKG 07/01/2019: Atrial fibrillation 91 bpm   Echocardiogram 05/19/2019:  Left ventricle cavity is normal in size. Mild concentric hypertrophy of  the left ventricle. Moderate global hypokinesis with moderately depressed  LV systolic function with visual EF 35-40%. Unable to evaluate diastolic  function due to atrial fibrillation.  Left atrial cavity is severely dilated.  Trileaflet aortic valve. Moderate (Grade II) aortic regurgitation.  Moderate (Grade III) mitral regurgitation.  Mild tricuspid regurgitation.  No evidence of pulmonary hypertension.  EKG 05/05/2019: Atrial fibrillation, controlled ventricular rate 94 bpm. Occasional ectopic ventricular beat     Recent labs: 04/19/2019: Glucose 83, BUN/Cr 13/0.78. EGFR normal. Na/K 140/4.2. Rest of the CMP normal H/H 15/43. MCV 91. Platelets 203    Review of Systems  Cardiovascular: Negative for chest pain, dyspnea on exertion, leg swelling, palpitations and syncope.       Vitals:   07/01/19 1520  BP: (Marland Kitchen  125/95  Pulse: 97  Temp: 97.6 F (36.4 C)  SpO2: 98%     Body mass index is 29.43 kg/m. Filed Weights   07/01/19 1520  Weight: 217 lb (98.4 kg)     Objective:   Physical Exam  Constitutional: He appears well-developed and well-nourished.  Neck: No JVD present.  Cardiovascular: Normal rate, normal heart sounds and intact distal pulses. An irregularly irregular rhythm present.  No murmur  heard. Pulmonary/Chest: Effort normal and breath sounds normal. He has no wheezes. He has no rales.  Musculoskeletal:        General: No edema.  Nursing note and vitals reviewed.       Assessment & Recommendations:   59 y.o. Caucsian male with new diagnosis of atrial fibrillation, HFrEF.  Persistent atrial fibrillation: Minimally symptomatic. Rate is well controlled.  However, given reduced LVEF, recommend restoring sinus rhythm.  With new diagnosis of minimally symptomatic heart failure, his CHA2DS2-VASc score is 2, annual stroke risk 2.2%.  Recommend anticoagulation with Eliquis 5 mg twice daily. Recommend cardioversion in 4 weeks.  HFrEF: New diagnosis.  NYHA I-II symptoms with minimal exertional dyspnea. Moderate aortic and mitral regurgitation. I will start Entresto 24-26 mg twice daily.  Check BMP in 1 week. I am hopeful that this is arrhythmia induced cardiomyopathy, that may improve after restoring sinus rhythm.  Nigel Mormon, MD [facility] Pager: [phone] Office: [phone]

## 2019-07-03 ENCOUNTER — Encounter: Payer: Self-pay | Admitting: Cardiology

## 2019-07-03 DIAGNOSIS — I5020 Unspecified systolic (congestive) heart failure: Secondary | ICD-10-CM | POA: Insufficient documentation

## 2019-07-12 ENCOUNTER — Telehealth: Payer: Self-pay

## 2019-07-12 NOTE — Telephone Encounter (Signed)
Patient called and stated that the [facility] Barer was over $700 and PA was denied. Patient has a procedure : cardioversion, on 07/27/2019. He says he has enough to last him until then, and I have also give him a Technical brewer and advised him to continue all medications as directed.

## 2019-07-12 NOTE — Telephone Encounter (Signed)
Thank you. Continue all medications. He may also need patient assistance for Entresto.  Thanks MJP

## 2019-07-16 NOTE — Telephone Encounter (Signed)
Patient aware. Patient Assistance has been completed and submitted, waiting on denial or approval.

## 2019-07-23 ENCOUNTER — Other Ambulatory Visit (HOSPITAL_COMMUNITY)
Admission: RE | Admit: 2019-07-23 | Discharge: 2019-07-23 | Disposition: A | Discharge status: Home / Self Care | Payer: 59 | Source: Ambulatory Visit | Attending: Cardiology | Admitting: Cardiology

## 2019-07-23 DIAGNOSIS — Z20822 Contact with and (suspected) exposure to covid-19: Secondary | ICD-10-CM | POA: Insufficient documentation

## 2019-07-23 LAB — SARS CORONAVIRUS 2 (TAT 6-24 HRS): SARS Coronavirus 2: NEGATIVE

## 2019-07-27 ENCOUNTER — Ambulatory Visit (HOSPITAL_COMMUNITY): Payer: 59 | Admitting: Certified Registered Nurse Anesthetist

## 2019-07-27 ENCOUNTER — Other Ambulatory Visit: Payer: Self-pay

## 2019-07-27 ENCOUNTER — Encounter (HOSPITAL_COMMUNITY): Payer: Self-pay | Admitting: Cardiology

## 2019-07-27 ENCOUNTER — Encounter (HOSPITAL_COMMUNITY)
Admission: RE | Disposition: A | Discharge status: Home / Self Care | Payer: Self-pay | Source: Home / Self Care | Attending: Cardiology

## 2019-07-27 ENCOUNTER — Ambulatory Visit (HOSPITAL_COMMUNITY)
Admission: RE | Admit: 2019-07-27 | Discharge: 2019-07-27 | Disposition: A | Discharge status: Home / Self Care | Payer: 59 | Attending: Cardiology | Admitting: Cardiology

## 2019-07-27 DIAGNOSIS — I5020 Unspecified systolic (congestive) heart failure: Secondary | ICD-10-CM | POA: Insufficient documentation

## 2019-07-27 DIAGNOSIS — I4819 Other persistent atrial fibrillation: Secondary | ICD-10-CM | POA: Diagnosis not present

## 2019-07-27 HISTORY — PX: CARDIOVERSION: SHX1299

## 2019-07-27 SURGERY — CARDIOVERSION
Anesthesia: General

## 2019-07-27 MED ORDER — METOPROLOL TARTRATE 5 MG/5ML IV SOLN
INTRAVENOUS | Status: AC
  Filled 2019-07-27: qty 5

## 2019-07-27 MED ORDER — METOPROLOL TARTRATE 5 MG/5ML IV SOLN
5.0000 mg | Freq: Once | INTRAVENOUS | Status: AC
  Administered 2019-07-27: 5 mg via INTRAVENOUS

## 2019-07-27 MED ORDER — LIDOCAINE 2% (20 MG/ML) 5 ML SYRINGE
INTRAMUSCULAR | Status: DC | PRN
  Administered 2019-07-27: 60 mg via INTRAVENOUS

## 2019-07-27 MED ORDER — METOPROLOL TARTRATE 25 MG PO TABS: 25.0000 mg | ORAL_TABLET | Freq: Two times a day (BID) | ORAL | 1 refills | Status: DC

## 2019-07-27 MED ORDER — SODIUM CHLORIDE 0.9 % IV SOLN: INTRAVENOUS | Status: DC

## 2019-07-27 MED ORDER — PROPOFOL 10 MG/ML IV BOLUS
INTRAVENOUS | Status: DC | PRN
  Administered 2019-07-27: 130 mg via INTRAVENOUS

## 2019-07-27 MED ORDER — METOPROLOL TARTRATE 5 MG/5ML IV SOLN: 1.0000 mg | Freq: Once | INTRAVENOUS | Status: DC

## 2019-07-27 NOTE — Discharge Instructions (Signed)
Electrical Cardioversion Electrical cardioversion is the delivery of a jolt of electricity to restore a normal rhythm to the heart. A rhythm that is too fast or is not regular keeps the heart from pumping well. In this procedure, sticky patches or metal paddles are placed on the chest to deliver electricity to the heart from a device. This procedure may be done in an emergency if:  There is low or no blood pressure as a result of the heart rhythm.  Normal rhythm must be restored as fast as possible to protect the brain and heart from further damage.  It may save a life. This may also be a scheduled procedure for irregular or fast heart rhythms that are not immediately life-threatening. Tell a health care provider about:  Any allergies you have.  All medicines you are taking, including vitamins, herbs, eye drops, creams, and over-the-counter medicines.  Any problems you or family members have had with anesthetic medicines.  Any blood disorders you have.  Any surgeries you have had.  Any medical conditions you have.  Whether you are pregnant or may be pregnant. What are the risks? Generally, this is a safe procedure. However, problems may occur, including:  Allergic reactions to medicines.  A blood clot that breaks free and travels to other parts of your body.  The possible return of an abnormal heart rhythm within hours or days after the procedure.  Your heart stopping (cardiac arrest). This is rare. What happens before the procedure? Medicines  Your health care provider may have you start taking: ? Blood-thinning medicines (anticoagulants) so your blood does not clot as easily. ? Medicines to help stabilize your heart rate and rhythm.  Ask your health care provider about: ? Changing or stopping your regular medicines. This is especially important if you are taking diabetes medicines or blood thinners. ? Taking medicines such as aspirin and ibuprofen. These medicines can  thin your blood. Do not take these medicines unless your health care provider tells you to take them. ? Taking over-the-counter medicines, vitamins, herbs, and supplements. General instructions  Follow instructions from your health care provider about eating or drinking restrictions.  Plan to have someone take you home from the hospital or clinic.  If you will be going home right after the procedure, plan to have someone with you for 24 hours.  Ask your health care provider what steps will be taken to help prevent infection. These may include washing your skin with a germ-killing soap. What happens during the procedure?   An IV will be inserted into one of your veins.  Sticky patches (electrodes) or metal paddles may be placed on your chest.  You will be given a medicine to help you relax (sedative).  An electrical shock will be delivered. The procedure may vary among health care providers and hospitals. What can I expect after the procedure?  Your blood pressure, heart rate, breathing rate, and blood oxygen level will be monitored until you leave the hospital or clinic.  Your heart rhythm will be watched to make sure it does not change.  You may have some redness on the skin where the shocks were given. Follow these instructions at home:  Do not drive for 24 hours if you were given a sedative during your procedure.  Take over-the-counter and prescription medicines only as told by your health care provider.  Ask your health care provider how to check your pulse. Check it often.  Rest for 48 hours after the procedure or   as told by your health care provider.  Avoid or limit your caffeine use as told by your health care provider.  Keep all follow-up visits as told by your health care provider. This is important. Contact a health care provider if:  You feel like your heart is beating too quickly or your pulse is not regular.  You have a serious muscle cramp that does not go  away. Get help right away if:  You have discomfort in your chest.  You are dizzy or you feel faint.  You have trouble breathing or you are short of breath.  Your speech is slurred.  You have trouble moving an arm or leg on one side of your body.  Your fingers or toes turn cold or blue. Summary  Electrical cardioversion is the delivery of a jolt of electricity to restore a normal rhythm to the heart.  This procedure may be done right away in an emergency or may be a scheduled procedure if the condition is not an emergency.  Generally, this is a safe procedure.  After the procedure, check your pulse often as told by your health care provider. This information is not intended to replace advice given to you by your health care provider. Make sure you discuss any questions you have with your health care provider. Document Revised: 12/07/2018 Document Reviewed: 12/07/2018 Elsevier Patient Education  2020 [employer]  

## 2019-07-27 NOTE — Transfer of Care (Signed)
Immediate Anesthesia Transfer of Care Note  Patient: Dylan Wilkerson  Procedure(s) Performed: CARDIOVERSION (N/A )  Patient Location: Endoscopy Unit  Anesthesia Type:General  Level of Consciousness: drowsy and responds to stimulation  Airway & Oxygen Therapy: Patient Spontanous Breathing  Post-op Assessment: Report given to RN and Post -op Vital signs reviewed and stable  Post vital signs: Reviewed and stable  Last Vitals:  Vitals Value Taken Time  BP    Temp    Pulse    Resp    SpO2      Last Pain:  Vitals:   07/27/19 1214  TempSrc: Oral  PainSc: 0-No pain         Complications: No apparent anesthesia complications

## 2019-07-27 NOTE — Anesthesia Procedure Notes (Signed)
Procedure Name: General with mask airway Date/Time: 07/27/2019 12:39 PM Performed by: Janace Litten, CRNA Pre-anesthesia Checklist: Patient identified, Emergency Drugs available, Suction available, Patient being monitored and Timeout performed Patient Re-evaluated:Patient Re-evaluated prior to induction Oxygen Delivery Method: Ambu bag Preoxygenation: Pre-oxygenation with 100% oxygen Induction Type: IV induction

## 2019-07-27 NOTE — Anesthesia Preprocedure Evaluation (Addendum)
Anesthesia Evaluation  Patient identified by MRN, date of birth, ID band Patient awake    Reviewed: Allergy & Precautions, NPO status , Patient's Chart, lab work & pertinent test results  History of Anesthesia Complications Negative for: history of anesthetic complications  Airway Mallampati: II  TM Distance: >3 FB Neck ROM: Full    Dental   Pulmonary neg pulmonary ROS,    Pulmonary exam normal        Cardiovascular + dysrhythmias Atrial Fibrillation  Rhythm:Irregular Rate:Normal     Neuro/Psych negative neurological ROS  negative psych ROS   GI/Hepatic negative GI ROS, Neg liver ROS,   Endo/Other  negative endocrine ROS  Renal/GU negative Renal ROS  negative genitourinary   Musculoskeletal negative musculoskeletal ROS (+)   Abdominal   Peds  Hematology negative hematology ROS (+)   Anesthesia Other Findings   Reproductive/Obstetrics                            Anesthesia Physical Anesthesia Plan  ASA: III  Anesthesia Plan: General   Post-op Pain Management:    Induction: Intravenous  PONV Risk Score and Plan: TIVA and Treatment may vary due to age or medical condition  Airway Management Planned: Mask  Additional Equipment: None  Intra-op Plan:   Post-operative Plan:   Informed Consent: I have reviewed the patients History and Physical, chart, labs and discussed the procedure including the risks, benefits and alternatives for the proposed anesthesia with the patient or authorized representative who has indicated his/her understanding and acceptance.       Plan Discussed with:   Anesthesia Plan Comments:        Anesthesia Quick Evaluation

## 2019-07-27 NOTE — CV Procedure (Signed)
Direct current cardioversion:  Indication symptomatic A. Fibrillation.  Procedure: Using 130 mg of IV Propofol and 50 IV Lidocaine (for reducing venous pain) for achieving deep sedation, synchronized direct current cardioversion performed. Patient was delivered with 1560x1, 200 Joules of electricity X 3 with success to NSR. Patient tolerated the procedure well. No immediate complication noted.  Also received 5 mg lopressor IV prior to procedure.   Adrian Prows, MD, [facility] 07/27/2019, 12:54 PM [facility] [place] Office: [phone]

## 2019-07-27 NOTE — Interval H&P Note (Signed)
History and Physical Interval Note:  07/27/2019 12:43 PM  Dylan Wilkerson  has presented today for surgery, with the diagnosis of A-FIB.  The various methods of treatment have been discussed with the patient and family. After consideration of risks, benefits and other options for treatment, the patient has consented to  Procedure(s): CARDIOVERSION (N/A) as a surgical intervention.  The patient's history has been reviewed, patient examined, no change in status, stable for surgery.  I have reviewed the patient's chart and labs.  Questions were answered to the patient's satisfaction.     Adrian Prows

## 2019-07-27 NOTE — Anesthesia Postprocedure Evaluation (Signed)
Anesthesia Post Note  Patient: Dylan Wilkerson  Procedure(s) Performed: CARDIOVERSION (N/A )     Patient location during evaluation: Endoscopy Anesthesia Type: MAC Level of consciousness: awake and alert Pain management: pain level controlled Vital Signs Assessment: post-procedure vital signs reviewed and stable Respiratory status: spontaneous breathing, nonlabored ventilation and respiratory function stable Cardiovascular status: blood pressure returned to baseline and stable Postop Assessment: no apparent nausea or vomiting Anesthetic complications: no    Last Vitals:  Vitals:   07/27/19 1317 07/27/19 1327  BP: (!) 89/60 99/81  Pulse:  (!) 59  Resp: 17 17  Temp:    SpO2:  100%    Last Pain:  Vitals:   07/27/19 1327  TempSrc:   PainSc: 0-No pain                 Lidia Collum

## 2019-07-28 ENCOUNTER — Encounter: Payer: Self-pay | Admitting: *Deleted

## 2019-08-06 ENCOUNTER — Other Ambulatory Visit: Payer: Self-pay

## 2019-08-06 ENCOUNTER — Encounter: Payer: Self-pay | Admitting: Cardiology

## 2019-08-06 ENCOUNTER — Ambulatory Visit: Payer: 59 | Admitting: Cardiology

## 2019-08-06 VITALS — BP 112/71 | HR 58 | Temp 98.0°F | Resp 18 | Ht 72.0 in | Wt 218.8 lb

## 2019-08-06 DIAGNOSIS — R0683 Snoring: Secondary | ICD-10-CM

## 2019-08-06 DIAGNOSIS — I5020 Unspecified systolic (congestive) heart failure: Secondary | ICD-10-CM

## 2019-08-06 DIAGNOSIS — I4819 Other persistent atrial fibrillation: Secondary | ICD-10-CM

## 2019-08-06 MED ORDER — APIXABAN 5 MG PO TABS: 5.0000 mg | ORAL_TABLET | Freq: Two times a day (BID) | ORAL | 3 refills | Status: DC

## 2019-08-06 MED ORDER — METOPROLOL TARTRATE 25 MG PO TABS: 25.0000 mg | ORAL_TABLET | Freq: Two times a day (BID) | ORAL | 1 refills | Status: DC

## 2019-08-06 NOTE — Progress Notes (Signed)
Patient referred by Josetta Huddle, MD for atrial fibrillation  Subjective:   Dylan Wilkerson, male    DOB: [birth year], 59 y.o.   MRN: [id number]   Chief Complaint  Patient presents with  . Atrial Fibrillation  . Follow-up     HPI  59 y.o. Caucsian male with new diagnosis of atrial fibrillation, HFrEF.  Afib was new diagnosis in 04/2019. Echocardiogram showed reduced EF. In terms of symptoms, he has minimal exertional dyspnea, denies any chest pain. Heart rate is better controlled.  He underwent successful cardioversion.  He does not have any chest pain, shortness of breath, palpitation symptoms.  He reports that Delene Loll is very expensive.   Initial consultation HPI 05/05/2019: Patient has no prior cardiac history.  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  His prior medical history includes early cataract, retinal detachment, for which he sees Dr. Luberta Mutter and Dr. Sherlynn Stalls.  Patient recently saw Dr. Inda Merlin for his annual physical exam, his then he was incidentally found to have atrial fibrillation.  He walks for about 2 miles every day.  He denies any symptoms of chest pain, shortness of breath, palpitations.  Since the diagnosis of atrial fibrillation, he has reduced his caffeine intake from 5 to 6 cups to 1-2 cups.  Blood pressure mildly elevated today, but is historically normal.  He does not have diabetes, history of prior strokes, known vascular disease.  He denies snoring at night.  He is reportedly had sleep study prior, and was normal.   Current Outpatient Medications on File Prior to Visit  Medication Sig Dispense Refill  . apixaban (ELIQUIS) 5 MG TABS tablet Take 1 tablet (5 mg total) by mouth 2 (two) times daily. (Patient taking differently: Take 2.5 mg by mouth 2 (two) times daily. ) 60 tablet 3  . Bioflavonoid Products (ESTER-C) TABS Take 1 tablet by mouth daily. 1078m    . brimonidine-timolol (COMBIGAN)  0.2-0.5 % ophthalmic solution Place 1 drop into both eyes 2 (two) times daily.     . Cholecalciferol (VITAMIN D3) 125 MCG (5000 UT) TABS Take 5,000 Units by mouth daily.     . diphenhydrAMINE (BENADRYL) 25 MG tablet Take 25 mg by mouth as needed for allergies.     .Marland Kitchenloratadine-pseudoephedrine (CLARITIN-D 12-HOUR) 5-120 MG tablet Take 1 tablet by mouth daily.     . metoprolol tartrate (LOPRESSOR) 25 MG tablet Take 1 tablet (25 mg total) by mouth 2 (two) times daily. 60 tablet 1  . Misc Natural Products (GLUCOSAMINE CHOND MSM FORMULA) TABS Take 1 tablet by mouth daily. 150 mg  1103 mg    . Multiple Vitamins-Minerals (MULTIVITAMIN MEN 50+ PO) Take 1 tablet by mouth daily.     . Multiple Vitamins-Minerals (PRESERVISION AREDS 2 PO) Take 1 tablet by mouth in the morning and at bedtime.     . Omega 3-6-9 Fatty Acids (OMEGA 3-6-9 COMPLEX) CAPS Take 1 tablet by mouth daily.    . sacubitril-valsartan (ENTRESTO) 24-26 MG Take 1 tablet by mouth 2 (two) times daily. 60 tablet 3  . Saw Palmetto 450 MG CAPS Take 450 mg by mouth daily.     .Marland Kitchentriamcinolone cream (KENALOG) 0.1 % Apply 1 application topically as needed (Hand for eczema). Hand for eczema    . zinc gluconate 50 MG tablet Take 50 mg by mouth daily.     No current facility-administered medications on file prior to visit.    Cardiovascular and other pertinent  studies:  EKG 08/06/2019: Sinus rhythm 54 bpm. Isolated lateral T wave inversion, probably nonspecific.   Direct current cardioversion 07/27/2019: Indication symptomatic A. Fibrillation. Procedure: Using 130 mg of IV Propofol and 50 IV Lidocaine (for reducing venous pain) for achieving deep sedation, synchronized direct current cardioversion performed. Patient was delivered with 1560x1, 200 Joules of electricity X 3 with success to NSR. Patient tolerated the procedure well. No immediate complication noted.  Also received 5 mg lopressor IV prior to procedure.    EKG 07/01/2019: Atrial  fibrillation 91 bpm   Echocardiogram 05/19/2019:  Left ventricle cavity is normal in size. Mild concentric hypertrophy of  the left ventricle. Moderate global hypokinesis with moderately depressed  LV systolic function with visual EF 35-40%. Unable to evaluate diastolic  function due to atrial fibrillation.  Left atrial cavity is severely dilated.  Trileaflet aortic valve. Moderate (Grade II) aortic regurgitation.  Moderate (Grade III) mitral regurgitation.  Mild tricuspid regurgitation.  No evidence of pulmonary hypertension.  EKG 05/05/2019: Atrial fibrillation, controlled ventricular rate 94 bpm. Occasional ectopic ventricular beat     Recent labs: 04/19/2019: Glucose 83, BUN/Cr 13/0.78. EGFR normal. Na/K 140/4.2. Rest of the CMP normal H/H 15/43. MCV 91. Platelets 203    Review of Systems  Cardiovascular: Negative for chest pain, dyspnea on exertion, leg swelling, palpitations and syncope.       Vitals:   08/06/19 1516  BP: 112/71  Pulse: (!) 58  Resp: 18  Temp: 98 F (36.7 C)  SpO2: 98%     Body mass index is 29.67 kg/m. Filed Weights   08/06/19 1516  Weight: 218 lb 12.8 oz (99.2 kg)     Objective:   Physical Exam  Constitutional: He appears well-developed and well-nourished.  Neck: No JVD present.  Cardiovascular: Regular rhythm, normal heart sounds and intact distal pulses. Bradycardia present.  No murmur heard. Pulmonary/Chest: Effort normal and breath sounds normal. He has no wheezes. He has no rales.  Musculoskeletal:        General: No edema.  Nursing note and vitals reviewed.       Assessment & Recommendations:   59 y.o. Caucsian male with new diagnosis of atrial fibrillation, HFrEF.  Persistent atrial fibrillation: Now maintaining sinus rhythm status post cardioversion. I suspect this was etiology for his heart failure.  Okay to stop Entresto. CHA2DS2-VASc score is 2, annual stroke risk 2% Continue Eliquis 5 mg twice daily and  metoprolol 25 mg twice daily. Repeat echocardiogram in 2 months.  If improved, could consider stopping Eliquis. Recommend sleep study evaluation given concern for OSA.  Nigel Mormon, MD [facility] [place] Pager: [phone] Office: [phone]

## 2019-09-07 ENCOUNTER — Encounter: Payer: Self-pay | Admitting: Neurology

## 2019-09-07 ENCOUNTER — Other Ambulatory Visit: Payer: Self-pay

## 2019-09-07 ENCOUNTER — Ambulatory Visit (INDEPENDENT_AMBULATORY_CARE_PROVIDER_SITE_OTHER): Payer: 59 | Admitting: Neurology

## 2019-09-07 VITALS — BP 121/81 | HR 57 | Ht 72.0 in | Wt 218.0 lb

## 2019-09-07 DIAGNOSIS — I4891 Unspecified atrial fibrillation: Secondary | ICD-10-CM | POA: Diagnosis not present

## 2019-09-07 DIAGNOSIS — R634 Abnormal weight loss: Secondary | ICD-10-CM | POA: Diagnosis not present

## 2019-09-07 DIAGNOSIS — R351 Nocturia: Secondary | ICD-10-CM

## 2019-09-07 DIAGNOSIS — E663 Overweight: Secondary | ICD-10-CM

## 2019-09-07 DIAGNOSIS — G4733 Obstructive sleep apnea (adult) (pediatric): Secondary | ICD-10-CM | POA: Diagnosis not present

## 2019-09-07 NOTE — Patient Instructions (Signed)
Thank you for choosing [facility] for your sleep related care! It was nice to meet you today! I appreciate that you entrust me with your sleep related healthcare concerns. I hope, I was able to address at least some of your concerns today, and that I can help you feel reassured and also get better.    Here is what we discussed today and what we came up with as our plan for you:    Based on your symptoms and your exam I believe you may be at risk for obstructive sleep apnea and would benefit from reevaluation as it has been many years and you have lost weight. If you have more than mild OSA, I want you to consider ongoing treatment with CPAP. Please remember, the risks and ramifications of moderate to severe obstructive sleep apnea or OSA are: Cardiovascular disease, including congestive heart failure, stroke, difficult to control hypertension, arrhythmias, and even type 2 diabetes has been linked to untreated OSA. Sleep apnea causes disruption of sleep and sleep deprivation in most cases, which, in turn, can cause recurrent headaches, problems with memory, mood, concentration, focus, and vigilance. Most people with untreated sleep apnea report excessive daytime sleepiness, which can affect their ability to drive. Please do not drive if you feel sleepy.   I will likely see you back after your sleep study to go over the test results and where to go from there. We will call you after your sleep study to advise about the results (most likely, you will hear from [place], my nurse) and to set up an appointment at the time, as necessary.    Our sleep lab administrative assistant will call you to schedule your sleep study. If you don't hear back from her by about 2 weeks from now, please feel free to call her at [phone]. You can leave a message with your phone number and concerns, if you get the voicemail box. She will call back as soon as possible.

## 2019-09-07 NOTE — Progress Notes (Signed)
Subjective:    Patient ID: Dylan Wilkerson is a 59 y.o. male.  HPI     Star Age, MD, PhD [facility] [address] [place], [place] [address]  Dear Dr. Virgina Jock,   I saw your patient, Dylan Wilkerson upon your kind request in my sleep clinic today for initial consultation of his sleep disorder, in particular, concern for underlying obstructive sleep apnea.  The patient is unaccompanied today.  As you know, Dylan Wilkerson is a 59 year old right-handed gentleman with an underlying medical history of seasonal allergies, kidney lesion, eczema, A. fib with status post cardioversion, and overweight state, who reports a prior diagnosis of obstructive sleep apnea several years ago.  Sleep study testing was through ENT, Dr. Erik Obey at the time, probably 15 or 20 years ago he recalls.  As he recalls, he had significant sleep apnea at the time and was on a CPAP machine but was able to lose quite a bit of weight.  He no longer uses CPAP therapy but has not had a sleep study in years.  He is single, lives alone, does not know if snores currently.  His Epworth sleepiness score is 1 out of 24, fatigue severity score is 16 out of 63.  He does not have a family history of sleep apnea.  He tries to stay active, walks about 5 miles per day, has worked on stress reduction and practices yoga on a regular basis.  He has never had his tonsils out. I reviewed your office note from 08/06/2019.  He has reduced his caffeine intake since his A. fib diagnosis.  He goes to bed between 10 and 11 and rise time is currently around 8.  He is retired as an Chief Financial Officer working for [employer] but would like to find some work in [employer] sector now for a few years.  He denies any morning headaches.  He has nocturia about once per average night.  He has no pets in the house.  He has a TV in the bedroom but turns it off before falling asleep.  He is a non-smoker and drinks alcohol  occasionally.  His Past Medical History Is Significant For: Past Medical History:  Diagnosis Date  . Eczema   . Kidney lesion    "has been cleared"  . Seasonal allergies     His Past Surgical History Is Significant For: Past Surgical History:  Procedure Laterality Date  . CARDIOVERSION N/A 07/27/2019   Procedure: CARDIOVERSION;  Surgeon: Adrian Prows, MD;  Location: [facility] ENDOSCOPY;  Service: Cardiovascular;  Laterality: N/A;  . CATARACT EXTRACTION Bilateral    age 66 right eye, age 71 lft eye    His Family History Is Significant For: Family History  Problem Relation Age of Onset  . Dementia Father   . Prostate cancer Father     His Social History Is Significant For: Social History   Socioeconomic History  . Marital status: Single    Spouse name: Not on file  . Number of children: 0  . Years of education: Not on file  . Highest education level: Not on file  Occupational History  . Not on file  Tobacco Use  . Smoking status: Never Smoker  . Smokeless tobacco: Never Used  Substance and Sexual Activity  . Alcohol use: No  . Drug use: Not on file  . Sexual activity: Not on file  Other Topics Concern  . Not on file  Social History Narrative  .  Not on file   Social Determinants of Health   Financial Resource Strain:   . Difficulty of Paying Living Expenses:   Food Insecurity:   . Worried About Charity fundraiser in the Last Year:   . Arboriculturist in the Last Year:   Transportation Needs:   . Film/video editor (Medical):   Marland Kitchen Lack of Transportation (Non-Medical):   Physical Activity:   . Days of Exercise per Week:   . Minutes of Exercise per Session:   Stress:   . Feeling of Stress :   Social Connections:   . Frequency of Communication with Friends and Family:   . Frequency of Social Gatherings with Friends and Family:   . Attends Religious Services:   . Active Member of Clubs or Organizations:   . Attends Archivist Meetings:   Marland Kitchen Marital  Status:     His Allergies Are:  Allergies  Allergen Reactions  . Dairy Aid [Lactase] Other (See Comments)  :   His Current Medications Are:  Outpatient Encounter Medications as of 09/07/2019  Medication Sig  . apixaban (ELIQUIS) 5 MG TABS tablet Take 1 tablet (5 mg total) by mouth 2 (two) times daily.  Marland Kitchen Bioflavonoid Products (ESTER-C) TABS Take 1 tablet by mouth daily. 1000mg   . brimonidine-timolol (COMBIGAN) 0.2-0.5 % ophthalmic solution Place 1 drop into both eyes 2 (two) times daily.   . Cholecalciferol (VITAMIN D3) 125 MCG (5000 UT) TABS Take 5,000 Units by mouth daily.   . diphenhydrAMINE (BENADRYL) 25 MG tablet Take 25 mg by mouth as needed for allergies.   Marland Kitchen loratadine-pseudoephedrine (CLARITIN-D 12-HOUR) 5-120 MG tablet Take 1 tablet by mouth daily.   . metoprolol tartrate (LOPRESSOR) 25 MG tablet Take 1 tablet (25 mg total) by mouth 2 (two) times daily.  . Misc Natural Products (GLUCOSAMINE CHOND MSM FORMULA) TABS Take 1 tablet by mouth daily. 150 mg  1103 mg  . Multiple Vitamins-Minerals (MULTIVITAMIN MEN 50+ PO) Take 1 tablet by mouth daily.   . Multiple Vitamins-Minerals (PRESERVISION AREDS 2 PO) Take 1 tablet by mouth in the morning and at bedtime.   . Omega 3-6-9 Fatty Acids (OMEGA 3-6-9 COMPLEX) CAPS Take 1 tablet by mouth daily.  . Saw Palmetto 450 MG CAPS Take 450 mg by mouth daily.   Marland Kitchen triamcinolone cream (KENALOG) 0.1 % Apply 1 application topically as needed (Hand for eczema). Hand for eczema  . zinc gluconate 50 MG tablet Take 50 mg by mouth daily.   No facility-administered encounter medications on file as of 09/07/2019.  :  Review of Systems:  Out of a complete 14 point review of systems, all are reviewed and negative with the exception of these symptoms as listed below: Review of Systems  Neurological:       Pt presents today to discuss his sleep. Pt has had a sleep study in the past and placed on cpap but he lost a significant amount of weight and the cpap  was d/c. Pt is unsure if he snores currently.  Epworth Sleepiness Scale 0= would never doze 1= slight chance of dozing 2= moderate chance of dozing 3= high chance of dozing  Sitting and reading: 0 Watching TV: 0 Sitting inactive in a public place (ex. Theater or meeting): 1 As a passenger in a car for an hour without a break: 0 Lying down to rest in the afternoon: 0 Sitting and talking to someone: 0 Sitting quietly after lunch (no alcohol):0 In a car,  while stopped in traffic: 0 Total: 1     Objective:  Neurological Exam  Physical Exam Physical Examination:   Vitals:   09/07/19 1318  BP: 121/81  Pulse: (!) 57    General Examination: The patient is a very pleasant 59 y.o. male in no acute distress. He appears well-developed and well-nourished and well groomed.   HEENT: Normocephalic, atraumatic, pupils are slightly unequal and left pupil is slightly distorted, both are mildly reactive to light, status post bilateral cataract repairs, corrective eyeglasses in place.  Extraocular tracking is preserved, hearing is grossly intact, face is symmetric with normal facial animation, speech is clear with no dysarthria or hypophonia.  No lip, neck or jaw tremor.  Airway examination reveals moderate mouth dryness, tongue protrudes centrally in palate elevates symmetrically, small airway noted, Mallampati class III, tonsils and uvula not fully visualized.  Neck circumference 17-1/4 inches.  He has a mild to moderate overbite.  No carotid bruits.   Chest: Clear to auscultation without wheezing, rhonchi or crackles noted.  Heart: S1+S2+0, regular and normal without murmurs, rubs or gallops noted.   Abdomen: Soft, non-tender and non-distended with normal bowel sounds appreciated on auscultation.  Extremities: There is no pitting edema in the distal lower extremities bilaterally.   Skin: Warm and dry without trophic changes noted.   Musculoskeletal: exam reveals no obvious joint  deformities, tenderness or joint swelling or erythema.   Neurologically:  Mental status: The patient is awake, alert and oriented in all 4 spheres. His immediate and remote memory, attention, language skills and fund of knowledge are appropriate. There is no evidence of aphasia, agnosia, apraxia or anomia. Speech is clear with normal prosody and enunciation. Thought process is linear. Mood is normal and affect is normal.  Cranial nerves II - XII are as described above under HEENT exam.  Motor exam: Normal bulk, strength and tone is noted. There is no tremor, Romberg is negative. Fine motor skills and coordination: grossly intact.  Cerebellar testing: No dysmetria or intention tremor. There is no truncal or gait ataxia.  Sensory exam: intact to light touch in the upper and lower extremities.  Gait, station and balance: He stands easily. No veering to one side is noted. No leaning to one side is noted. Posture is age-appropriate and stance is narrow based. Gait shows normal stride length and normal pace. No problems turning are noted. Tandem walk is unremarkable.               Assessment and Plan:  In summary, Dylan Wilkerson is a very pleasant 59 y.o.-year old male with an underlying medical history of seasonal allergies, kidney lesion, eczema, A. fib with status post cardioversion, and overweight state, who presents for evaluation of his sleep disorder.  He was previously diagnosed with obstructive sleep apnea many years ago but was able to lose quite a bit of weight.  He was recently diagnosed with A. fib and had successful cardioversion in March 2021.  Given his prior history of sleep apnea and his A. fib diagnosis I would recommend we proceed with a sleep study to rule out obstructive sleep apnea.   I had a long chat with the patient about my findings and the diagnosis of OSA, its prognosis and treatment options. We talked about medical treatments, surgical interventions and non-pharmacological  approaches. I explained in particular the risks and ramifications of untreated moderate to severe OSA, especially with respect to developing cardiovascular disease down the Road, including congestive heart failure, difficult  to treat hypertension, cardiac arrhythmias, or stroke. Even type 2 diabetes has, in part, been linked to untreated OSA. Symptoms of untreated OSA include daytime sleepiness, memory problems, mood irritability and mood disorder such as depression and anxiety, lack of energy, as well as recurrent headaches, especially morning headaches. We talked about trying to maintain a healthy lifestyle in general, as well as the importance of weight control. We also talked about the importance of good sleep hygiene. I recommended the following at this time: sleep study.  I explained the sleep test procedure to the patient and also outlined possible surgical and non-surgical treatment options of OSA.  He indicated that he would be willing to try CPAP if the need arises. I explained the importance of being compliant with PAP treatment, not only for insurance purposes but primarily to improve His symptoms, and for the patient's long term health benefit, including to reduce His cardiovascular risks. I answered all his questions today and the patient was in agreement. I plan to see him back after the sleep study is completed and encouraged him to call with any interim questions, concerns, problems or updates.   Thank you very much for allowing me to participate in the care of this nice patient. If I can be of any further assistance to you please do not hesitate to call me at [phone].  Sincerely,   Star Age, MD, PhD

## 2019-09-23 ENCOUNTER — Telehealth: Payer: Self-pay

## 2019-09-23 NOTE — Telephone Encounter (Signed)
Mailbox full on 2nd contact for HST

## 2019-09-28 ENCOUNTER — Telehealth: Payer: Self-pay

## 2019-09-28 NOTE — Telephone Encounter (Signed)
We have attempted to call the patient two times to schedule sleep study.  Patient has been unavailable at the phone numbers we have on file and has not returned our calls. If patient calls back we will schedule them for their sleep study.  

## 2019-10-05 ENCOUNTER — Ambulatory Visit: Payer: 59

## 2019-10-05 ENCOUNTER — Other Ambulatory Visit: Payer: Self-pay

## 2019-10-05 DIAGNOSIS — I4819 Other persistent atrial fibrillation: Secondary | ICD-10-CM

## 2019-11-12 ENCOUNTER — Other Ambulatory Visit: Payer: Self-pay

## 2019-11-12 ENCOUNTER — Ambulatory Visit: Payer: 59 | Admitting: Cardiology

## 2019-11-12 ENCOUNTER — Encounter: Payer: Self-pay | Admitting: Cardiology

## 2019-11-12 VITALS — BP 152/89 | HR 53 | Resp 17 | Ht 72.0 in | Wt 219.0 lb

## 2019-11-12 DIAGNOSIS — I10 Essential (primary) hypertension: Secondary | ICD-10-CM

## 2019-11-12 DIAGNOSIS — I480 Paroxysmal atrial fibrillation: Secondary | ICD-10-CM

## 2019-11-12 MED ORDER — LISINOPRIL 20 MG PO TABS: 20.0000 mg | ORAL_TABLET | Freq: Every day | ORAL | 2 refills | Status: DC

## 2019-11-12 NOTE — Progress Notes (Signed)
Patient referred by Dylan Huddle, MD for atrial fibrillation  Subjective:   Dylan Wilkerson, male    DOB: 1960/06/04, 59 y.o.   MRN: [id number]   Chief Complaint  Patient presents with   Atrial Fibrillation   Follow-up    3 month     HPI  59 y.o. Caucsian male with paroxysmal atrial fibrillation, HFrEF.  Patient underwent successful cardioversion on 07/27/2019.  Subsequent echocardiogram in May 2021 showed normalization of LVEF.  He is feeling great, tries to walk regularly. He denies chest pain, shortness of breath, palpitations, leg edema, orthopnea, PND, TIA/syncope.'Blood pressure is elevated today.    Current Outpatient Medications on File Prior to Visit  Medication Sig Dispense Refill   Bioflavonoid Products (ESTER-C) TABS Take 1 tablet by mouth daily. 1065m     brimonidine-timolol (COMBIGAN) 0.2-0.5 % ophthalmic solution Place 1 drop into both eyes 2 (two) times daily.      Cholecalciferol (VITAMIN D3) 125 MCG (5000 UT) TABS Take 5,000 Units by mouth daily.      diphenhydrAMINE (BENADRYL) 25 MG tablet Take 25 mg by mouth as needed for allergies.      loratadine-pseudoephedrine (CLARITIN-D 12-HOUR) 5-120 MG tablet Take 1 tablet by mouth daily.      metoprolol tartrate (LOPRESSOR) 25 MG tablet Take 1 tablet (25 mg total) by mouth 2 (two) times daily. 60 tablet 1   Misc Natural Products (GLUCOSAMINE CHOND MSM FORMULA) TABS Take 1 tablet by mouth daily. 150 mg  1103 mg     Multiple Vitamins-Minerals (MULTIVITAMIN MEN 50+ PO) Take 1 tablet by mouth daily.      Multiple Vitamins-Minerals (PRESERVISION AREDS 2 PO) Take 1 tablet by mouth in the morning and at bedtime.      Omega 3-6-9 Fatty Acids (OMEGA 3-6-9 COMPLEX) CAPS Take 1 tablet by mouth daily.     Saw Palmetto 450 MG CAPS Take 450 mg by mouth daily.      triamcinolone cream (KENALOG) 0.1 % Apply 1 application topically as needed (Hand for eczema). Hand for eczema     zinc gluconate 50 MG tablet  Take 50 mg by mouth daily.     No current facility-administered medications on file prior to visit.    Cardiovascular and other pertinent studies:  Echocardiogram 10/05/2019:  Left ventricle cavity is normal in size. Normal LV systolic function with  EF 55%. Normal global wall motion. Moderate concentric hypertrophy of the  left ventricle. Doppler evidence of grade I (impaired) diastolic  dysfunction, normal LAP.  Left atrial cavity is moderately dilated.  Trileaflet aortic valve. Moderate (Grade II) aortic regurgitation.  Mild (Grade I) mitral regurgitation.  Mild pulmonic regurgitation.  IVC is dilated with respiratory variation. Estimated RA pressure 8 mmHg.  Compared to previous study on 05/19/2019, EF has improved from 35-40% to  55%. Rhythm on single monitoring lead now appears to be sinus.   EKG 08/06/2019: Sinus rhythm 54 bpm. Isolated lateral T wave inversion, probably nonspecific.   Direct current cardioversion 07/27/2019: Indication symptomatic A. Fibrillation. Procedure: Using 130 mg of IV Propofol and 50 IV Lidocaine (for reducing venous pain) for achieving deep sedation, synchronized direct current cardioversion performed. Patient was delivered with 1560x1, 200 Joules of electricity X 3 with success to NSR. Patient tolerated the procedure well. No immediate complication noted.  Also received 5 mg lopressor IV prior to procedure.    EKG 07/01/2019: Atrial fibrillation 91 bpm    Recent labs: 04/19/2019: Glucose 83, BUN/Cr 13/0.78. EGFR normal. Na/K  140/4.2. Rest of the CMP normal H/H 15/43. MCV 91. Platelets 203    Review of Systems  Cardiovascular: Negative for chest pain, dyspnea on exertion, leg swelling, palpitations and syncope.       Vitals:   11/12/19 1328  BP: (!) 159/92  Pulse: (!) 51  Resp: 17  SpO2: 98%     Body mass index is 29.7 kg/m. Filed Weights   11/12/19 1328  Weight: 219 lb (99.3 kg)     Objective:   Physical Exam Vitals  and nursing note reviewed.  Constitutional:      Appearance: He is well-developed.  Neck:     Vascular: No JVD.  Cardiovascular:     Rate and Rhythm: Regular rhythm. Bradycardia present.     Pulses: Intact distal pulses.     Heart sounds: Normal heart sounds. No murmur heard.   Pulmonary:     Effort: Pulmonary effort is normal.     Breath sounds: Normal breath sounds. No wheezing or rales.         Assessment & Recommendations:   59 y.o. Caucsian male with paroxysmal atrial fibrillation, now with recovered EF  Paroxysmal atrial fibrillation: Now maintaining sinus rhythm status post cardioversion. I suspect this was etiology for his heart failure.   CHA2DS2-VASc score is 1, annual stroke risk <1% Continue Eliquis 5 mg twice daily and metoprolol 25 mg twice daily. Given complete recovery of EF with cardioversion, I have stopped his eliquis. Recommend sleep study evaluation given concern for OSA.  Hypertension: Uncontrolled. Added lisinopril 20 mg daily. Check BMP in 2 weeks  F/u in 4-6 weeks  Dylan Durrett Esther Hardy, MD [facility] Pager: [phone] Office: [phone]

## 2019-11-16 ENCOUNTER — Other Ambulatory Visit: Payer: Self-pay | Admitting: Cardiology

## 2019-11-16 DIAGNOSIS — I4819 Other persistent atrial fibrillation: Secondary | ICD-10-CM

## 2019-11-19 ENCOUNTER — Other Ambulatory Visit (HOSPITAL_COMMUNITY): Payer: Self-pay | Admitting: Cardiology

## 2019-11-20 LAB — BASIC METABOLIC PANEL
BUN/Creatinine Ratio: 22 (ref 10–24)
BUN: 17 mg/dL (ref 8–27)
CO2: 26 mmol/L (ref 20–29)
Calcium: 9.6 mg/dL (ref 8.6–10.2)
Chloride: 102 mmol/L (ref 96–106)
Creatinine, Ser: 0.76 mg/dL (ref 0.76–1.27)
GFR calc Af Amer: 115 mL/min/{1.73_m2} (ref >59)
GFR calc non Af Amer: 99 mL/min/{1.73_m2} (ref >59)
Glucose: 84 mg/dL (ref 65–99)
Potassium: 4.4 mmol/L (ref 3.5–5.2)
Sodium: 140 mmol/L (ref 134–144)

## 2019-12-22 ENCOUNTER — Ambulatory Visit: Payer: 59 | Admitting: Cardiology

## 2019-12-31 ENCOUNTER — Ambulatory Visit: Payer: 59 | Admitting: Cardiology

## 2019-12-31 ENCOUNTER — Encounter: Payer: Self-pay | Admitting: Cardiology

## 2019-12-31 ENCOUNTER — Other Ambulatory Visit: Payer: Self-pay

## 2019-12-31 VITALS — BP 150/89 | HR 52 | Resp 17 | Ht 72.0 in | Wt 217.0 lb

## 2019-12-31 DIAGNOSIS — I10 Essential (primary) hypertension: Secondary | ICD-10-CM

## 2019-12-31 DIAGNOSIS — I480 Paroxysmal atrial fibrillation: Secondary | ICD-10-CM

## 2019-12-31 MED ORDER — LISINOPRIL 40 MG PO TABS: 20.0000 mg | ORAL_TABLET | Freq: Every day | ORAL | 2 refills | Status: DC

## 2019-12-31 MED ORDER — AMLODIPINE BESYLATE 5 MG PO TABS: 5.0000 mg | ORAL_TABLET | Freq: Every day | ORAL | 3 refills | Status: DC

## 2019-12-31 NOTE — Progress Notes (Signed)
Patient referred by Dylan Huddle, MD for atrial fibrillation  Subjective:   Dylan Wilkerson, male    DOB: 04-Oct-1960, 59 y.o.   MRN: [id number]   Chief Complaint  Patient presents with  . Hypertension  . Atrial Fibrillation  . Follow-up    4-6 week     HPI  59 y.o. Caucsian male with paroxysmal atrial fibrillation, HFrEF.  Patient underwent successful cardioversion on 07/27/2019.  EF has recovered since cardioversion.   He is feeling great, tries to walk regularly. He denies chest pain, shortness of breath, palpitations, leg edema, orthopnea, PND, TIA/syncope. However, blood pressure remains elevated.  Current Outpatient Medications on File Prior to Visit  Medication Sig Dispense Refill  . Bioflavonoid Products (ESTER-C) TABS Take 1 tablet by mouth daily. 1076m    . brimonidine-timolol (COMBIGAN) 0.2-0.5 % ophthalmic solution Place 1 drop into both eyes 2 (two) times daily.     . cetirizine (ZYRTEC) 10 MG tablet Take 10 mg by mouth daily.    . Cholecalciferol (VITAMIN D3) 125 MCG (5000 UT) TABS Take 5,000 Units by mouth daily.     . clobetasol ointment (TEMOVATE) 0.05 % Apply topically 2 (two) times daily.    . diphenhydrAMINE (BENADRYL) 25 MG tablet Take 25 mg by mouth as needed for allergies.     .Marland Kitchenlisinopril (ZESTRIL) 20 MG tablet Take 1 tablet (20 mg total) by mouth daily. 60 tablet 2  . metoprolol tartrate (LOPRESSOR) 25 MG tablet TAKE 1 TABLET(25 MG) BY MOUTH TWICE DAILY 60 tablet 1  . Misc Natural Products (GLUCOSAMINE CHOND MSM FORMULA) TABS Take 1 tablet by mouth daily. 150 mg  1103 mg    . Multiple Vitamins-Minerals (MULTIVITAMIN MEN 50+ PO) Take 1 tablet by mouth daily.     . Multiple Vitamins-Minerals (PRESERVISION AREDS 2 PO) Take 1 tablet by mouth in the morning and at bedtime.     . Omega 3-6-9 Fatty Acids (OMEGA 3-6-9 COMPLEX) CAPS Take 1 tablet by mouth daily.    . Saw Palmetto 450 MG CAPS Take 450 mg by mouth daily.     .Marland Kitchentriamcinolone cream (KENALOG)  0.1 % Apply 1 application topically as needed (Hand for eczema). Hand for eczema    . zinc gluconate 50 MG tablet Take 50 mg by mouth daily.    .Marland Kitchenloratadine-pseudoephedrine (CLARITIN-D 12-HOUR) 5-120 MG tablet Take 1 tablet by mouth daily.  (Patient not taking: Reported on 12/31/2019)     No current facility-administered medications on file prior to visit.    Cardiovascular and other pertinent studies:  Echocardiogram 10/05/2019:  Left ventricle cavity is normal in size. Normal LV systolic function with  EF 55%. Normal global wall motion. Moderate concentric hypertrophy of the  left ventricle. Doppler evidence of grade I (impaired) diastolic  dysfunction, normal LAP.  Left atrial cavity is moderately dilated.  Trileaflet aortic valve. Moderate (Grade II) aortic regurgitation.  Mild (Grade I) mitral regurgitation.  Mild pulmonic regurgitation.  IVC is dilated with respiratory variation. Estimated RA pressure 8 mmHg.  Compared to previous study on 05/19/2019, EF has improved from 35-40% to  55%. Rhythm on single monitoring lead now appears to be sinus.   EKG 08/06/2019: Sinus rhythm 54 bpm. Isolated lateral T wave inversion, probably nonspecific.   Direct current cardioversion 07/27/2019: Indication symptomatic A. Fibrillation. Procedure: Using 130 mg of IV Propofol and 50 IV Lidocaine (for reducing venous pain) for achieving deep sedation, synchronized direct current cardioversion performed. Patient was delivered with 1560x1, 200  Joules of electricity X 3 with success to NSR. Patient tolerated the procedure well. No immediate complication noted.  Also received 5 mg lopressor IV prior to procedure.    EKG 07/01/2019: Atrial fibrillation 91 bpm    Recent labs: 11/19/2019: Glucose 84, BUN/Cr 17/0.76. EGFR 99. Na/K 140/4.4.    04/19/2019: Glucose 83, BUN/Cr 13/0.78. EGFR normal. Na/K 140/4.2. Rest of the CMP normal H/H 15/43. MCV 91. Platelets 203    Review of Systems    Cardiovascular: Negative for chest pain, dyspnea on exertion, leg swelling, palpitations and syncope.       Vitals:   12/31/19 1437 12/31/19 1441  BP: (!) 149/92 (!) 150/89  Pulse: (!) 53 (!) 52  Resp: 17   SpO2: 100%      Body mass index is 29.43 kg/m. Filed Weights   12/31/19 1437  Weight: 217 lb (98.4 kg)     Objective:   Physical Exam Vitals and nursing note reviewed.  Constitutional:      Appearance: He is well-developed.  Neck:     Vascular: No JVD.  Cardiovascular:     Rate and Rhythm: Regular rhythm. Bradycardia present.     Pulses: Intact distal pulses.     Heart sounds: Normal heart sounds. No murmur heard.   Pulmonary:     Effort: Pulmonary effort is normal.     Breath sounds: Normal breath sounds. No wheezing or rales.         Assessment & Recommendations:   59 y.o. Caucsian male with paroxysmal atrial fibrillation, now with recovered EF  Paroxysmal atrial fibrillation: Now maintaining sinus rhythm status post cardioversion. I suspect this was etiology for his heart failure.   CHA2DS2-VASc score is 1, annual stroke risk <1% Given complete recovery of EF with cardioversion, I have stopped his eliquis. Recommend sleep study evaluation given concern for OSA.  Hypertension: Uncontrolled. Increased lisinopril to 40 mg daily, switched metoprolol tartarate 25 mg bid to amlodipine 5 mg daily.  F/u in 2 months  Dylan Gentzler Esther Hardy, MD [facility] [place] Pager: [phone] Office: [phone]

## 2020-01-01 ENCOUNTER — Encounter: Payer: Self-pay | Admitting: Cardiology

## 2020-01-03 ENCOUNTER — Other Ambulatory Visit: Payer: Self-pay

## 2020-01-03 ENCOUNTER — Telehealth: Payer: Self-pay

## 2020-01-03 DIAGNOSIS — I10 Essential (primary) hypertension: Secondary | ICD-10-CM

## 2020-01-03 MED ORDER — LISINOPRIL 40 MG PO TABS: 40.0000 mg | ORAL_TABLET | Freq: Every day | ORAL | 1 refills | Status: DC

## 2020-01-11 ENCOUNTER — Other Ambulatory Visit: Payer: Self-pay | Admitting: Cardiology

## 2020-01-11 DIAGNOSIS — I4819 Other persistent atrial fibrillation: Secondary | ICD-10-CM

## 2020-02-28 NOTE — Progress Notes (Signed)
Patient referred by Josetta Huddle, MD for atrial fibrillation  Subjective:   Dylan Wilkerson, male    DOB: 1960-06-04, 59 y.o.   MRN: [id number]   Chief Complaint  Patient presents with   Essential hypertension   Follow-up    8 week      HPI  59 y.o. Caucsian male with hypertension, paroxysmal atrial fibrillation, now with recovered EF  He is doing well, denies chest pain, shortness of breath, palpitations, leg edema, orthopnea, PND, TIA/syncope. Blood pressure is well controlled. He is still taking eliquis 5 mg bid.He has not undergone a sleep study.  EKG today showed Afib.   Current Outpatient Medications on File Prior to Visit  Medication Sig Dispense Refill   amLODipine (NORVASC) 5 MG tablet Take 1 tablet (5 mg total) by mouth daily. 180 tablet 3   Bioflavonoid Products (ESTER-C) TABS Take 1 tablet by mouth daily. 1051m     brimonidine-timolol (COMBIGAN) 0.2-0.5 % ophthalmic solution Place 1 drop into both eyes 2 (two) times daily.      cetirizine (ZYRTEC) 10 MG tablet Take 10 mg by mouth daily.     Cholecalciferol (VITAMIN D3) 125 MCG (5000 UT) TABS Take 5,000 Units by mouth daily.      clobetasol ointment (TEMOVATE) 0.05 % Apply topically 2 (two) times daily.     diphenhydrAMINE (BENADRYL) 25 MG tablet Take 25 mg by mouth as needed for allergies.      lisinopril (ZESTRIL) 40 MG tablet Take 1 tablet (40 mg total) by mouth daily. Take one tablet by mouth daily. 90 tablet 1   loratadine-pseudoephedrine (CLARITIN-D 12-HOUR) 5-120 MG tablet Take 1 tablet by mouth daily.      Misc Natural Products (GLUCOSAMINE CHOND MSM FORMULA) TABS Take 1 tablet by mouth daily. 150 mg  1103 mg     Multiple Vitamins-Minerals (MULTIVITAMIN MEN 50+ PO) Take 1 tablet by mouth daily.      Multiple Vitamins-Minerals (PRESERVISION AREDS 2 PO) Take 1 tablet by mouth in the morning and at bedtime.      Omega 3-6-9 Fatty Acids (OMEGA 3-6-9 COMPLEX) CAPS Take 1 tablet by mouth  daily.     Saw Palmetto 450 MG CAPS Take 450 mg by mouth daily.      triamcinolone cream (KENALOG) 0.1 % Apply 1 application topically as needed (Hand for eczema). Hand for eczema     zinc gluconate 50 MG tablet Take 50 mg by mouth daily.     No current facility-administered medications on file prior to visit.    Cardiovascular and other pertinent studies:  EKG 03/01/2020: Atrial fibrillation 83 bpm  Echocardiogram 10/05/2019:  Left ventricle cavity is normal in size. Normal LV systolic function with  EF 55%. Normal global wall motion. Moderate concentric hypertrophy of the  left ventricle. Doppler evidence of grade I (impaired) diastolic  dysfunction, normal LAP.  Left atrial cavity is moderately dilated.  Trileaflet aortic valve. Moderate (Grade II) aortic regurgitation.  Mild (Grade I) mitral regurgitation.  Mild pulmonic regurgitation.  IVC is dilated with respiratory variation. Estimated RA pressure 8 mmHg.  Compared to previous study on 05/19/2019, EF has improved from 35-40% to  55%. Rhythm on single monitoring lead now appears to be sinus.   EKG 08/06/2019: Sinus rhythm 54 bpm. Isolated lateral T wave inversion, probably nonspecific.   Direct current cardioversion 07/27/2019: Indication symptomatic A. Fibrillation. Procedure: Using 130 mg of IV Propofol and 50 IV Lidocaine (for reducing venous pain) for achieving deep sedation, synchronized  direct current cardioversion performed. Patient was delivered with 1560x1, 200 Joules of electricity X 3 with success to NSR. Patient tolerated the procedure well. No immediate complication noted.  Also received 5 mg lopressor IV prior to procedure.    EKG 07/01/2019: Atrial fibrillation 91 bpm    Recent labs: 11/19/2019: Glucose 84, BUN/Cr 17/0.76. EGFR 99. Na/K 140/4.4.    04/19/2019: Glucose 83, BUN/Cr 13/0.78. EGFR normal. Na/K 140/4.2. Rest of the CMP normal H/H 15/43. MCV 91. Platelets 203    Review of Systems    Cardiovascular: Negative for chest pain, dyspnea on exertion, leg swelling, palpitations and syncope.       Vitals:   03/01/20 1024  BP: 121/79  Pulse: 60  Resp: 16  SpO2: 97%     Body mass index is 29.02 kg/m. Filed Weights   03/01/20 1024  Weight: 214 lb (97.1 kg)     Objective:   Physical Exam Vitals and nursing note reviewed.  Constitutional:      Appearance: He is well-developed.  Neck:     Vascular: No JVD.  Cardiovascular:     Rate and Rhythm: Regular rhythm. Bradycardia present.     Pulses: Intact distal pulses.     Heart sounds: Normal heart sounds. No murmur heard.   Pulmonary:     Effort: Pulmonary effort is normal.     Breath sounds: Normal breath sounds. No wheezing or rales.         Assessment & Recommendations:   60 y.o. Caucsian male with hypertension, paroxysmal atrial fibrillation, now with recovered EF  Paroxysmal atrial fibrillation: Back in Afib as of EKG today 03/01/20. While he is asymptomatic, he has had arrhythmia induced cardiomyopathy in the past. Therefore, I recommend cardioversion.  CHA2DS2-VASc score is 1, annual stroke risk <1%. Continue eliquis for now Recommend sleep study evaluation given concern for OSA.  Hypertension: Well controlled  F/u after cardioversion  Nigel Mormon, MD [facility] [place] Pager: [phone] Office: [phone]

## 2020-03-01 ENCOUNTER — Other Ambulatory Visit: Payer: Self-pay

## 2020-03-01 ENCOUNTER — Encounter: Payer: Self-pay | Admitting: Cardiology

## 2020-03-01 ENCOUNTER — Ambulatory Visit: Payer: 59 | Admitting: Cardiology

## 2020-03-01 VITALS — BP 121/79 | HR 60 | Resp 16 | Ht 72.0 in | Wt 214.0 lb

## 2020-03-01 DIAGNOSIS — I10 Essential (primary) hypertension: Secondary | ICD-10-CM

## 2020-03-01 DIAGNOSIS — I480 Paroxysmal atrial fibrillation: Secondary | ICD-10-CM

## 2020-03-01 MED ORDER — APIXABAN 5 MG PO TABS: 5.0000 mg | ORAL_TABLET | Freq: Two times a day (BID) | ORAL | 2 refills | Status: DC

## 2020-03-09 LAB — BASIC METABOLIC PANEL
BUN/Creatinine Ratio: 15 (ref 10–24)
BUN: 12 mg/dL (ref 8–27)
CO2: 26 mmol/L (ref 20–29)
Calcium: 9.2 mg/dL (ref 8.6–10.2)
Chloride: 102 mmol/L (ref 96–106)
Creatinine, Ser: 0.79 mg/dL (ref 0.76–1.27)
GFR calc Af Amer: 113 mL/min/{1.73_m2} (ref >59)
GFR calc non Af Amer: 98 mL/min/{1.73_m2} (ref >59)
Glucose: 73 mg/dL (ref 65–99)
Potassium: 4.6 mmol/L (ref 3.5–5.2)
Sodium: 141 mmol/L (ref 134–144)

## 2020-03-10 ENCOUNTER — Other Ambulatory Visit (HOSPITAL_COMMUNITY)
Admission: RE | Admit: 2020-03-10 | Discharge: 2020-03-10 | Disposition: A | Discharge status: Home / Self Care | Payer: 59 | Source: Ambulatory Visit | Attending: Cardiology | Admitting: Cardiology

## 2020-03-10 DIAGNOSIS — Z01812 Encounter for preprocedural laboratory examination: Secondary | ICD-10-CM | POA: Insufficient documentation

## 2020-03-10 DIAGNOSIS — Z20822 Contact with and (suspected) exposure to covid-19: Secondary | ICD-10-CM | POA: Insufficient documentation

## 2020-03-10 LAB — SARS CORONAVIRUS 2 (TAT 6-24 HRS): SARS Coronavirus 2: NEGATIVE

## 2020-03-14 ENCOUNTER — Encounter (HOSPITAL_COMMUNITY): Payer: Self-pay | Admitting: Cardiology

## 2020-03-14 ENCOUNTER — Other Ambulatory Visit: Payer: Self-pay

## 2020-03-14 ENCOUNTER — Encounter (HOSPITAL_COMMUNITY)
Admission: RE | Disposition: A | Discharge status: Home / Self Care | Payer: Self-pay | Source: Home / Self Care | Attending: Cardiology

## 2020-03-14 ENCOUNTER — Ambulatory Visit (HOSPITAL_COMMUNITY): Payer: 59 | Admitting: Certified Registered Nurse Anesthetist

## 2020-03-14 ENCOUNTER — Ambulatory Visit (HOSPITAL_COMMUNITY)
Admission: RE | Admit: 2020-03-14 | Discharge: 2020-03-14 | Disposition: A | Discharge status: Home / Self Care | Payer: 59 | Attending: Cardiology | Admitting: Cardiology

## 2020-03-14 DIAGNOSIS — I480 Paroxysmal atrial fibrillation: Secondary | ICD-10-CM | POA: Insufficient documentation

## 2020-03-14 DIAGNOSIS — I4819 Other persistent atrial fibrillation: Secondary | ICD-10-CM

## 2020-03-14 DIAGNOSIS — I10 Essential (primary) hypertension: Secondary | ICD-10-CM | POA: Insufficient documentation

## 2020-03-14 HISTORY — PX: CARDIOVERSION: SHX1299

## 2020-03-14 SURGERY — CARDIOVERSION
Anesthesia: General

## 2020-03-14 MED ORDER — DILTIAZEM HCL ER COATED BEADS 120 MG PO CP24: 120.0000 mg | ORAL_CAPSULE | Freq: Every day | ORAL | 2 refills | Status: DC

## 2020-03-14 MED ORDER — SODIUM CHLORIDE 0.9 % IV SOLN: INTRAVENOUS | Status: DC | PRN

## 2020-03-14 MED ORDER — PROPOFOL 10 MG/ML IV BOLUS
INTRAVENOUS | Status: DC | PRN
  Administered 2020-03-14: 80 mg via INTRAVENOUS
  Administered 2020-03-14 (×2): 40 mg via INTRAVENOUS

## 2020-03-14 MED ORDER — LIDOCAINE HCL (CARDIAC) PF 100 MG/5ML IV SOSY
PREFILLED_SYRINGE | INTRAVENOUS | Status: DC | PRN
  Administered 2020-03-14: 60 mg via INTRAVENOUS

## 2020-03-14 NOTE — H&P (Signed)
OV 10/13 copied for documentation     Patient referred by No ref. provider found for atrial fibrillation  Subjective:   Dylan Wilkerson, male    DOB: [birth year], 59 y.o.   MRN: [id number]  C/C: Atrial fibrillation  HPI  59 y.o. Caucsian male with hypertension, paroxysmal atrial fibrillation, now with recovered EF  He is doing well, denies chest pain, shortness of breath, palpitations, leg edema, orthopnea, PND, TIA/syncope. Blood pressure is well controlled. He is still taking eliquis 5 mg bid.He has not undergone a sleep study.  EKG today showed Afib.   No current facility-administered medications on file prior to encounter.   Current Outpatient Medications on File Prior to Encounter  Medication Sig Dispense Refill  . amLODipine (NORVASC) 5 MG tablet Take 1 tablet (5 mg total) by mouth daily. (Patient taking differently: Take 5 mg by mouth daily at 12 noon. ) 180 tablet 3  . apixaban (ELIQUIS) 5 MG TABS tablet Take 1 tablet (5 mg total) by mouth 2 (two) times daily. (Patient taking differently: Take 5 mg by mouth 2 (two) times daily. After breakfast & after supper) 60 tablet 2  . Bioflavonoid Products (ESTER-C) TABS Take 1 tablet by mouth daily. 102m    . brimonidine-timolol (COMBIGAN) 0.2-0.5 % ophthalmic solution Place 1 drop into both eyes 2 (two) times daily.     . Cholecalciferol (VITAMIN D-3) 125 MCG (5000 UT) TABS Take 5,000 Units by mouth daily at 12 noon.    . clobetasol ointment (TEMOVATE) 03.41% Apply 1 application topically 2 (two) times daily as needed (skin irritation.).     .Marland KitchendiphenhydrAMINE (BENADRYL) 25 MG tablet Take 25 mg by mouth 3 (three) times daily as needed for allergies.     .Marland Kitchenlisinopril (ZESTRIL) 40 MG tablet Take 1 tablet (40 mg total) by mouth daily. Take one tablet by mouth daily. (Patient taking differently: Take 40 mg by mouth daily at 12 noon. Take one tablet by mouth daily.) 90 tablet 1  . loratadine (CLARITIN) 10 MG tablet Take 10 mg by mouth  daily.    . Misc Natural Products (GLUCOSAMINE CHOND MSM FORMULA) TABS Take 1 tablet by mouth daily at 12 noon.     . Multiple Vitamin (MULTIVITAMIN WITH MINERALS) TABS tablet Take 1 tablet by mouth daily at 12 noon.    . Multiple Vitamins-Minerals (PRESERVISION AREDS 2 PO) Take 1 tablet by mouth in the morning and at bedtime.     . Omega 3-6-9 Fatty Acids (OMEGA 3-6-9 COMPLEX) CAPS Take 1 tablet by mouth daily at 12 noon.     . Saw Palmetto 450 MG CAPS Take 450 mg by mouth daily at 12 noon.     . Saw Palmetto 450 MG CAPS Take 450 mg by mouth daily at 12 noon.    . triamcinolone cream (KENALOG) 0.1 % Apply 1 application topically as needed (Hand for eczema). Hand for eczema    . Zinc 50 MG TABS Take 50 mg by mouth daily at 12 noon.      Cardiovascular and other pertinent studies:  EKG 03/01/2020: Atrial fibrillation 83 bpm  Echocardiogram 10/05/2019:  Left ventricle cavity is normal in size. Normal LV systolic function with  EF 55%. Normal global wall motion. Moderate concentric hypertrophy of the  left ventricle. Doppler evidence of grade I (impaired) diastolic  dysfunction, normal LAP.  Left atrial cavity is moderately dilated.  Trileaflet aortic valve. Moderate (Grade II) aortic regurgitation.  Mild (Grade I) mitral regurgitation.  Mild  pulmonic regurgitation.  IVC is dilated with respiratory variation. Estimated RA pressure 8 mmHg.  Compared to previous study on 05/19/2019, EF has improved from 35-40% to  55%. Rhythm on single monitoring lead now appears to be sinus.   EKG 08/06/2019: Sinus rhythm 54 bpm. Isolated lateral T wave inversion, probably nonspecific.   Direct current cardioversion 07/27/2019: Indication symptomatic A. Fibrillation. Procedure: Using 130 mg of IV Propofol and 50 IV Lidocaine (for reducing venous pain) for achieving deep sedation, synchronized direct current cardioversion performed. Patient was delivered with 1560x1, 200 Joules of electricity X 3 with  success to NSR. Patient tolerated the procedure well. No immediate complication noted.  Also received 5 mg lopressor IV prior to procedure.    EKG 07/01/2019: Atrial fibrillation 91 bpm    Recent labs: 11/19/2019: Glucose 84, BUN/Cr 17/0.76. EGFR 99. Na/K 140/4.4.    04/19/2019: Glucose 83, BUN/Cr 13/0.78. EGFR normal. Na/K 140/4.2. Rest of the CMP normal H/H 15/43. MCV 91. Platelets 203    Review of Systems  Cardiovascular: Negative for chest pain, dyspnea on exertion, leg swelling, palpitations and syncope.       Vitals:   03/14/20 1215 03/14/20 1220  BP:  (!) 122/99  Resp: (!) 24   Temp: 97.6 F (36.4 C)   SpO2: 96%      Body mass index is 29.43 kg/m. Filed Weights   03/14/20 1215  Weight: 98.4 kg     Objective:   Physical Exam Vitals and nursing note reviewed.  Constitutional:      Appearance: He is well-developed.  Neck:     Vascular: No JVD.  Cardiovascular:     Rate and Rhythm: Regular rhythm. Bradycardia present.     Pulses: Intact distal pulses.     Heart sounds: Normal heart sounds. No murmur heard.   Pulmonary:     Effort: Pulmonary effort is normal.     Breath sounds: Normal breath sounds. No wheezing or rales.         Assessment & Recommendations:   58 y.o. Caucsian male with hypertension, paroxysmal atrial fibrillation, now with recovered EF  Paroxysmal atrial fibrillation: Back in Afib as of EKG today 03/01/20. While he is asymptomatic, he has had arrhythmia induced cardiomyopathy in the past. Therefore, I recommend cardioversion.  CHA2DS2-VASc score is 1, annual stroke risk <1%. Continue eliquis for now Recommend sleep study evaluation given concern for OSA.  Hypertension: Well controlled  F/u after cardioversion  Nigel Mormon, MD [facility] [place] Pager: [phone] Office: [phone]

## 2020-03-14 NOTE — Discharge Instructions (Signed)
Electrical Cardioversion Electrical cardioversion is the delivery of a jolt of electricity to restore a normal rhythm to the heart. A rhythm that is too fast or is not regular keeps the heart from pumping well. In this procedure, sticky patches or metal paddles are placed on the chest to deliver electricity to the heart from a device. This procedure may be done in an emergency if:  There is low or no blood pressure as a result of the heart rhythm.  Normal rhythm must be restored as fast as possible to protect the brain and heart from further damage.  It may save a life. This may also be a scheduled procedure for irregular or fast heart rhythms that are not immediately life-threatening. Tell a health care provider about:  Any allergies you have.  All medicines you are taking, including vitamins, herbs, eye drops, creams, and over-the-counter medicines.  Any problems you or family members have had with anesthetic medicines.  Any blood disorders you have.  Any surgeries you have had.  Any medical conditions you have.  Whether you are pregnant or may be pregnant. What are the risks? Generally, this is a safe procedure. However, problems may occur, including:  Allergic reactions to medicines.  A blood clot that breaks free and travels to other parts of your body.  The possible return of an abnormal heart rhythm within hours or days after the procedure.  Your heart stopping (cardiac arrest). This is rare. What happens before the procedure? Medicines  Your health care provider may have you start taking: ? Blood-thinning medicines (anticoagulants) so your blood does not clot as easily. ? Medicines to help stabilize your heart rate and rhythm.  Ask your health care provider about: ? Changing or stopping your regular medicines. This is especially important if you are taking diabetes medicines or blood thinners. ? Taking medicines such as aspirin and ibuprofen. These medicines can  thin your blood. Do not take these medicines unless your health care provider tells you to take them. ? Taking over-the-counter medicines, vitamins, herbs, and supplements. General instructions  Follow instructions from your health care provider about eating or drinking restrictions.  Plan to have someone take you home from the hospital or clinic.  If you will be going home right after the procedure, plan to have someone with you for 24 hours.  Ask your health care provider what steps will be taken to help prevent infection. These may include washing your skin with a germ-killing soap. What happens during the procedure?   An IV will be inserted into one of your veins.  Sticky patches (electrodes) or metal paddles may be placed on your chest.  You will be given a medicine to help you relax (sedative).  An electrical shock will be delivered. The procedure may vary among health care providers and hospitals. What can I expect after the procedure?  Your blood pressure, heart rate, breathing rate, and blood oxygen level will be monitored until you leave the hospital or clinic.  Your heart rhythm will be watched to make sure it does not change.  You may have some redness on the skin where the shocks were given. Follow these instructions at home:  Do not drive for 24 hours if you were given a sedative during your procedure.  Take over-the-counter and prescription medicines only as told by your health care provider.  Ask your health care provider how to check your pulse. Check it often.  Rest for 48 hours after the procedure or   as told by your health care provider.  Avoid or limit your caffeine use as told by your health care provider.  Keep all follow-up visits as told by your health care provider. This is important. Contact a health care provider if:  You feel like your heart is beating too quickly or your pulse is not regular.  You have a serious muscle cramp that does not go  away. Get help right away if:  You have discomfort in your chest.  You are dizzy or you feel faint.  You have trouble breathing or you are short of breath.  Your speech is slurred.  You have trouble moving an arm or leg on one side of your body.  Your fingers or toes turn cold or blue. Summary  Electrical cardioversion is the delivery of a jolt of electricity to restore a normal rhythm to the heart.  This procedure may be done right away in an emergency or may be a scheduled procedure if the condition is not an emergency.  Generally, this is a safe procedure.  After the procedure, check your pulse often as told by your health care provider. This information is not intended to replace advice given to you by your health care provider. Make sure you discuss any questions you have with your health care provider. Document Revised: 12/07/2018 Document Reviewed: 12/07/2018 Elsevier Patient Education  2020 [employer]  

## 2020-03-14 NOTE — Anesthesia Postprocedure Evaluation (Signed)
Anesthesia Post Note  Patient: Dylan Wilkerson  Procedure(s) Performed: CARDIOVERSION (N/A )     Patient location during evaluation: PACU Anesthesia Type: General Level of consciousness: awake and alert Pain management: pain level controlled Vital Signs Assessment: post-procedure vital signs reviewed and stable Respiratory status: spontaneous breathing, nonlabored ventilation, respiratory function stable and patient connected to nasal cannula oxygen Cardiovascular status: blood pressure returned to baseline and stable Postop Assessment: no apparent nausea or vomiting Anesthetic complications: no   No complications documented.  Last Vitals:  Vitals:   03/14/20 1305 03/14/20 1315  BP: 113/75 99/80  Pulse: (!) 102 99  Resp: 18 14  Temp:    SpO2: 97% 96%    Last Pain:  Vitals:   03/14/20 1325  TempSrc:   PainSc: 0-No pain                 Janeka Libman DAVID

## 2020-03-14 NOTE — Anesthesia Preprocedure Evaluation (Signed)
Anesthesia Evaluation  Patient identified by MRN, date of birth, ID band Patient awake    Reviewed: Patient's Chart, lab work & pertinent test results  Airway Mallampati: II  TM Distance: >3 FB Neck ROM: Full    Dental  (+) Teeth Intact   Pulmonary  Seasonal allergies   Pulmonary exam normal        Cardiovascular hypertension, Pt. on medications + dysrhythmias Atrial Fibrillation  Rhythm:Irregular Rate:Normal     Neuro/Psych negative neurological ROS  negative psych ROS   GI/Hepatic negative GI ROS, Neg liver ROS,   Endo/Other  negative endocrine ROS  Renal/GU negative Renal ROS     Musculoskeletal negative musculoskeletal ROS (+)   Abdominal (+)  Abdomen: soft. Bowel sounds: normal.  Peds  Hematology negative hematology ROS (+)   Anesthesia Other Findings   Reproductive/Obstetrics                             Anesthesia Physical Anesthesia Plan  ASA: III  Anesthesia Plan: General   Post-op Pain Management:    Induction: Intravenous  PONV Risk Score and Plan: 2 and Treatment may vary due to age or medical condition  Airway Management Planned: Mask  Additional Equipment: None  Intra-op Plan:   Post-operative Plan:   Informed Consent: I have reviewed the patients History and Physical, chart, labs and discussed the procedure including the risks, benefits and alternatives for the proposed anesthesia with the patient or authorized representative who has indicated his/her understanding and acceptance.     Dental advisory given  Plan Discussed with: CRNA  Anesthesia Plan Comments:         Anesthesia Quick Evaluation

## 2020-03-14 NOTE — Transfer of Care (Signed)
Immediate Anesthesia Transfer of Care Note  Patient: Dylan Wilkerson  Procedure(s) Performed: CARDIOVERSION (N/A )  Patient Location: PACU and Endoscopy Unit  Anesthesia Type:General  Level of Consciousness: awake and alert   Airway & Oxygen Therapy: Patient Spontanous Breathing  Post-op Assessment: Report given to RN, Post -op Vital signs reviewed and stable and Patient moving all extremities  Post vital signs: Reviewed and stable  Last Vitals:  Vitals Value Taken Time  BP    Temp    Pulse    Resp    SpO2      Last Pain:  Vitals:   03/14/20 1215  TempSrc: Oral  PainSc: 0-No pain         Complications: No complications documented.

## 2020-03-14 NOTE — CV Procedure (Signed)
Direct current cardioversion:  Indication symptomatic: Symptomatic atrial fibrillation  Procedure: Under deep sedation administered and monitored by anesthesiology, synchronized direct current cardioversion performed. Patient was delivered with 120, 150, 200, 200 Joules of electricity X 4 without conversion to NSR. Patient tolerated the procedure well. No immediate complication noted.   Switch amlodipine 5 mg to diltiazem 120 mg.  Nigel Mormon, MD [facility] [place] Pager: [phone] Office: [phone] If no answer Cell [phone]

## 2020-03-14 NOTE — Interval H&P Note (Signed)
History and Physical Interval Note:  03/14/2020 12:36 PM  Dylan Wilkerson  has presented today for surgery, with the diagnosis of A-FIB.  The various methods of treatment have been discussed with the patient and family. After consideration of risks, benefits and other options for treatment, the patient has consented to  Procedure(s): CARDIOVERSION (N/A) as a surgical intervention.  The patient's history has been reviewed, patient examined, no change in status, stable for surgery.  I have reviewed the patient's chart and labs.  Questions were answered to the patient's satisfaction.     Ali Molina

## 2020-03-15 ENCOUNTER — Encounter (HOSPITAL_COMMUNITY): Payer: Self-pay | Admitting: Cardiology

## 2020-03-19 NOTE — Progress Notes (Signed)
Patient referred by Josetta Huddle, MD for atrial fibrillation  Subjective:   Dylan Wilkerson, male    DOB: [birth year], 59 y.o.   MRN: [id number]   Chief Complaint  Patient presents with  . Atrial Fibrillation    POST CARDIOVERSION  . Follow-up     HPI  59 y.o. Caucsian male with hypertension, paroxysmal atrial fibrillation, now with recovered EF  Patient was noted back in Afib in 02/2020. Cardioversion on 03/14/2020 was unsuccessful.  He is rather disappointed that the cardioversion did not work.  However, he does not have any immediate symptoms of chest pain or shortness of breath, orthopnea, PND or leg edema.  He feels occasional palpitations.  Blood pressures well controlled.  He is tolerating Eliquis well.    Current Outpatient Medications on File Prior to Visit  Medication Sig Dispense Refill  . apixaban (ELIQUIS) 5 MG TABS tablet Take 1 tablet (5 mg total) by mouth 2 (two) times daily. (Patient taking differently: Take 5 mg by mouth 2 (two) times daily. After breakfast & after supper) 60 tablet 2  . Bioflavonoid Products (ESTER-C) TABS Take 1 tablet by mouth daily. 1095m    . brimonidine-timolol (COMBIGAN) 0.2-0.5 % ophthalmic solution Place 1 drop into both eyes 2 (two) times daily.     . Cholecalciferol (VITAMIN D-3) 125 MCG (5000 UT) TABS Take 5,000 Units by mouth daily at 12 noon.    . clobetasol ointment (TEMOVATE) 04.23% Apply 1 application topically 2 (two) times daily as needed (skin irritation.).     .Marland Kitchendiltiazem (CARDIZEM CD) 120 MG 24 hr capsule Take 1 capsule (120 mg total) by mouth daily. 30 capsule 2  . diphenhydrAMINE (BENADRYL) 25 MG tablet Take 25 mg by mouth 3 (three) times daily as needed for allergies.     .Marland Kitchenlisinopril (ZESTRIL) 40 MG tablet Take 1 tablet (40 mg total) by mouth daily. Take one tablet by mouth daily. (Patient taking differently: Take 40 mg by mouth daily at 12 noon. Take one tablet by mouth daily.) 90 tablet 1  . loratadine (CLARITIN)  10 MG tablet Take 10 mg by mouth daily.    . Misc Natural Products (GLUCOSAMINE CHOND MSM FORMULA) TABS Take 1 tablet by mouth daily at 12 noon.     . Multiple Vitamin (MULTIVITAMIN WITH MINERALS) TABS tablet Take 1 tablet by mouth daily at 12 noon.    . Multiple Vitamins-Minerals (PRESERVISION AREDS 2 PO) Take 1 tablet by mouth in the morning and at bedtime.     . Omega 3-6-9 Fatty Acids (OMEGA 3-6-9 COMPLEX) CAPS Take 1 tablet by mouth daily at 12 noon.     . Saw Palmetto 450 MG CAPS Take 450 mg by mouth daily at 12 noon.     . Saw Palmetto 450 MG CAPS Take 450 mg by mouth daily at 12 noon.    . triamcinolone cream (KENALOG) 0.1 % Apply 1 application topically as needed (Hand for eczema). Hand for eczema    . Zinc 50 MG TABS Take 50 mg by mouth daily at 12 noon.     No current facility-administered medications on file prior to visit.    Cardiovascular and other pertinent studies:  EKG 03/22/2020: Atrial fibrillation 83 bpm   Unsuccessful cardioversion attempt 03/14/2020  EKG 03/01/2020: Atrial fibrillation 83 bpm  Echocardiogram 10/05/2019:  Left ventricle cavity is normal in size. Normal LV systolic function with  EF 55%. Normal global wall motion. Moderate concentric hypertrophy of the  left  ventricle. Doppler evidence of grade I (impaired) diastolic  dysfunction, normal LAP.  Left atrial cavity is moderately dilated.  Trileaflet aortic valve. Moderate (Grade II) aortic regurgitation.  Mild (Grade I) mitral regurgitation.  Mild pulmonic regurgitation.  IVC is dilated with respiratory variation. Estimated RA pressure 8 mmHg.  Compared to previous study on 05/19/2019, EF has improved from 35-40% to  55%. Rhythm on single monitoring lead now appears to be sinus.   EKG 08/06/2019: Sinus rhythm 54 bpm. Isolated lateral T wave inversion, probably nonspecific.   Direct current cardioversion 07/27/2019: Indication symptomatic A. Fibrillation. Procedure: Using 130 mg of IV Propofol  and 50 IV Lidocaine (for reducing venous pain) for achieving deep sedation, synchronized direct current cardioversion performed. Patient was delivered with 1560x1, 200 Joules of electricity X 3 with success to NSR. Patient tolerated the procedure well. No immediate complication noted.  Also received 5 mg lopressor IV prior to procedure.    EKG 07/01/2019: Atrial fibrillation 91 bpm    Recent labs: 11/19/2019: Glucose 84, BUN/Cr 17/0.76. EGFR 99. Na/K 140/4.4.    04/19/2019: Glucose 83, BUN/Cr 13/0.78. EGFR normal. Na/K 140/4.2. Rest of the CMP normal H/H 15/43. MCV 91. Platelets 203    Review of Systems  Cardiovascular: Negative for chest pain, dyspnea on exertion, leg swelling, palpitations and syncope.       Vitals:   03/22/20 1036  BP: 107/72  Pulse: 84  SpO2: 99%     Body mass index is 29.29 kg/m. Filed Weights   03/22/20 1036  Weight: 216 lb (98 kg)     Objective:   Physical Exam Vitals and nursing note reviewed.  Constitutional:      Appearance: He is well-developed.  Neck:     Vascular: No JVD.  Cardiovascular:     Rate and Rhythm: Regular rhythm. Bradycardia present.     Pulses: Intact distal pulses.     Heart sounds: Normal heart sounds. No murmur heard.   Pulmonary:     Effort: Pulmonary effort is normal.     Breath sounds: Normal breath sounds. No wheezing or rales.         Assessment & Recommendations:   59 y.o. Caucsian male with hypertension, paroxysmal atrial fibrillation, now with recovered EF  Persistent l atrial fibrillation: Unsuccessful cardioversion attempt on 03/14/2020. Remains in rate controlled Afib without any symptoms. Tolerating diltiazem 120 mg daily. CHA2DS2-VASc score is 2, given prior history of heart failure, with annual stroke risk for 2%.  Continue Eliquis for milligram twice daily.  After him discussion, we mutually decided to continue rate control approach for now.  Should he develop RVR, or A. fib symptoms in  future, or if his EF reduces on follow-up echocardiogram, will consider rhythm control therapy.  His options at that point will be amiodarone Tikosyn versus ablation.  Recommend sleep study evaluation given concern for OSA.  Hypertension: Well controlled  F/u in 3 months after echocardiogram.  Nigel Mormon, MD [facility] [place] Pager: [phone] Office: [phone]

## 2020-03-20 ENCOUNTER — Ambulatory Visit (INDEPENDENT_AMBULATORY_CARE_PROVIDER_SITE_OTHER): Payer: 59 | Admitting: Neurology

## 2020-03-20 DIAGNOSIS — G4733 Obstructive sleep apnea (adult) (pediatric): Secondary | ICD-10-CM

## 2020-03-20 DIAGNOSIS — R351 Nocturia: Secondary | ICD-10-CM

## 2020-03-20 DIAGNOSIS — E663 Overweight: Secondary | ICD-10-CM

## 2020-03-20 DIAGNOSIS — R634 Abnormal weight loss: Secondary | ICD-10-CM

## 2020-03-20 DIAGNOSIS — I4891 Unspecified atrial fibrillation: Secondary | ICD-10-CM

## 2020-03-22 ENCOUNTER — Encounter: Payer: Self-pay | Admitting: Cardiology

## 2020-03-22 ENCOUNTER — Ambulatory Visit: Payer: 59 | Admitting: Cardiology

## 2020-03-22 ENCOUNTER — Other Ambulatory Visit: Payer: Self-pay

## 2020-03-22 VITALS — BP 107/72 | HR 84 | Ht 72.0 in | Wt 216.0 lb

## 2020-03-22 DIAGNOSIS — I10 Essential (primary) hypertension: Secondary | ICD-10-CM

## 2020-03-22 DIAGNOSIS — I4819 Other persistent atrial fibrillation: Secondary | ICD-10-CM

## 2020-03-22 MED ORDER — APIXABAN 5 MG PO TABS: 5.0000 mg | ORAL_TABLET | Freq: Two times a day (BID) | ORAL | 3 refills | Status: DC

## 2020-03-28 NOTE — Procedures (Signed)
.     [facility]  HOME SLEEP TEST (Watch PAT)  STUDY DATE: 03/20/20  DOB: 1960/09/06  MRN: [id number]  ORDERING CLINICIAN: Star Age, MD, PhD   REFERRING CLINICIAN: Dr. Virgina Jock  CLINICAL INFORMATION/HISTORY: 59 year old man with a history of seasonal allergies, kidney lesion, eczema, A. fib with status post cardioversion, and overweight state, who reports a prior diagnosis of obstructive sleep apnea several years ago. He was on a CPAP machine but was able to lose quite a bit of weight.  He no longer uses CPAP therapy but has not had a sleep study in years.   Epworth sleepiness score: 1/24.  BMI: 29.6 kg/m  FINDINGS:   Total Record Time (hours, min): 9 H 16 min  Total Sleep Time (hours, min):  7 H 48 min   Percent REM (%):    15.15 %   Calculated pAHI (per hour):  27.7       REM pAHI:    43.9    NREM pAHI: 26.3   Oxygen Saturation (%) Mean: 95 Minimum oxygen saturation (%): 90   O2 Saturation Range (%): (99-90)  O2Saturation (minutes) <=88%: 0 min   Pulse Mean (bpm):  78 bpm  Pulse Range (116-78)   IMPRESSION: OSA (obstructive sleep apnea)    RECOMMENDATION:  This home sleep test demonstrates moderate obstructive sleep apnea - by number of events - with a total AHI of 27.7/hour and O2 nadir of 90%. Given his medical history, in particular, his diagnosis of atrial fibrillation, treatment with positive airway pressure is recommended. The patient will be advised to proceed with an autoPAP titration/trial at home for now. A full night titration study may be considered to optimize treatment settings, if needed down the road. Please note that untreated obstructive sleep apnea may carry additional perioperative morbidity. Patients with significant obstructive sleep apnea should receive perioperative PAP therapy and the surgeons and particularly the anesthesiologist should be informed of the diagnosis and the severity of the sleep disordered breathing. The  patient should be cautioned not to drive, work at heights, or operate dangerous or heavy equipment when tired or sleepy. Review and reiteration of good sleep hygiene measures should be pursued with any patient. Other causes of the patient's symptoms, including circadian rhythm disturbances, an underlying mood disorder, medication effect and/or an underlying medical problem cannot be ruled out based on this test. Clinical correlation is recommended. The patient and his referring provider will be notified of the test results. The patient will be seen in follow up in sleep clinic at [facility].  I certify that I have reviewed the raw data recording prior to the issuance of this report in accordance with the standards of the American Academy of Sleep Medicine (AASM).  INTERPRETING PHYSICIAN:    Star Age, MD, PhD  Board Certified in Neurology and Sleep Medicine  [facility] [address], [place], [place] [address] [phone]

## 2020-03-28 NOTE — Addendum Note (Signed)
Addended by: Star Age on: 03/28/2020 09:35 PM   Modules accepted: Orders

## 2020-03-28 NOTE — Progress Notes (Signed)
Patient referred by Dr. Virgina Jock, seen by me on 09/07/19, HST on 03/20/20.    Please call and notify the patient that the recent home sleep test showed obstructive sleep apnea in the moderate range. I recommend treatment for this in the form of autoPAP, which means, that we don't have to bring him in for a sleep study with CPAP, but will let him start a so called autoPAP machine at home, through a DME company (of his choice, or as per insurance requirement). The DME representative will educate him on how to use the machine, how to put the mask on, etc. I have placed an order in the chart. Please send referral, talk to patient, send report to referring MD. We will need a FU in sleep clinic for 10 weeks post-PAP set up, please arrange that with me or one of our NPs. Thanks,   Star Age, MD, PhD [facility])

## 2020-03-28 NOTE — Progress Notes (Signed)
Patient referred by Dr. Virgina Jock, seen by me on 09/07/19, HST on 03/20/20.    Please call and notify the patient that the recent home sleep test showed obstructive sleep apnea in the moderate range. I recommend treatment for this in the form of autoPAP, which means, that we don't have to bring him in for a sleep study with CPAP, but will let him start a so called autoPAP machine at home, through a DME company (of his choice, or as per insurance requirement). The DME representative will educate him on how to use the machine, how to put the mask on, etc. I have placed an order in the chart. Please send referral, talk to patient, send report to referring MD. We will need a FU in sleep clinic for 10 weeks post-PAP set up, please arrange that with me or one of our NPs. Thanks,   Star Age, MD, PhD [facility])

## 2020-03-29 ENCOUNTER — Telehealth: Payer: Self-pay

## 2020-03-29 NOTE — Telephone Encounter (Signed)
Pt returned phone call would like to be contacted on his cellphone. [phone]

## 2020-03-29 NOTE — Telephone Encounter (Signed)
-----   Message from Star Age, MD sent at 03/28/2020  9:35 PM EST ----- Patient referred by Dr. Virgina Jock, seen by me on 09/07/19, HST on 03/20/20.    Please call and notify the patient that the recent home sleep test showed obstructive sleep apnea in the moderate range. I recommend treatment for this in the form of autoPAP, which means, that we don't have to bring him in for a sleep study with CPAP, but will let him start a so called autoPAP machine at home, through a DME company (of his choice, or as per insurance requirement). The DME representative will educate him on how to use the machine, how to put the mask on, etc. I have placed an order in the chart. Please send referral, talk to patient, send report to referring MD. We will need a FU in sleep clinic for 10 weeks post-PAP set up, please arrange that with me or one of our NPs. Thanks,   Star Age, MD, PhD [facility])

## 2020-03-29 NOTE — Telephone Encounter (Signed)
I called pt. I advised pt that Dr. Rexene Alberts reviewed their sleep study results and found that pt has moderate osa. Dr. Rexene Alberts recommends that pt start an auto pap. I reviewed PAP compliance expectations with the pt. Pt is agreeable to starting an auto-PAP. I advised pt that an order will be sent to a DME, [facility], and [facility] will call the pt within about one week after they file with the pt's insurance. [facility] will show the pt how to use the machine, fit for masks, and troubleshoot the auto-PAP if needed. A follow up appt was made for insurance purposes with Dr. Rexene Alberts on 07/03/2020 at 2:00pm. Pt verbalized understanding to arrive 15 minutes early and bring their auto-PAP. A letter with all of this information in it will be mailed to the pt as a reminder. I verified with the pt that the address we have on file is correct. Pt verbalized understanding of results. Pt had no questions at this time but was encouraged to call back if questions arise. I have sent the order to [facility] and have received confirmation that they have received the order.

## 2020-03-29 NOTE — Telephone Encounter (Signed)
I called pt to discuss his sleep study results. No answer, VM full.

## 2020-05-09 ENCOUNTER — Other Ambulatory Visit: Payer: Self-pay | Admitting: Cardiology

## 2020-05-23 ENCOUNTER — Other Ambulatory Visit: Payer: Self-pay | Admitting: Cardiology

## 2020-05-23 DIAGNOSIS — I4819 Other persistent atrial fibrillation: Secondary | ICD-10-CM

## 2020-05-24 ENCOUNTER — Other Ambulatory Visit: Payer: Self-pay | Admitting: Cardiology

## 2020-05-24 DIAGNOSIS — I10 Essential (primary) hypertension: Secondary | ICD-10-CM

## 2020-06-22 ENCOUNTER — Encounter: Payer: Self-pay | Admitting: Cardiology

## 2020-06-22 ENCOUNTER — Other Ambulatory Visit: Payer: Self-pay

## 2020-06-22 ENCOUNTER — Ambulatory Visit: Payer: 59 | Admitting: Cardiology

## 2020-06-22 VITALS — BP 111/72 | HR 90 | Temp 97.7°F | Resp 16 | Ht 72.0 in | Wt 219.4 lb

## 2020-06-22 DIAGNOSIS — I10 Essential (primary) hypertension: Secondary | ICD-10-CM

## 2020-06-22 DIAGNOSIS — I4819 Other persistent atrial fibrillation: Secondary | ICD-10-CM

## 2020-06-22 NOTE — Progress Notes (Signed)
Patient referred by Josetta Huddle, MD for atrial fibrillation  Subjective:   Dylan Wilkerson, male    DOB: 12-22-1960, 60 y.o.   MRN: [id number]   Chief Complaint  Patient presents with  . Atrial Fibrillation  . Hypertension  . Follow-up    3 month     HPI  60 y.o. Caucsian male with hypertension, paroxysmal atrial fibrillation, now with recovered EF  Patient is doing well, tries to exercise and perform yoga regularly. He denies chest pain, shortness of breath, palpitations, leg edema, orthopnea, PND, TIA/syncope.   Current Outpatient Medications on File Prior to Visit  Medication Sig Dispense Refill  . Bioflavonoid Products (ESTER-C) TABS Take 1 tablet by mouth daily. 1063m    . brimonidine-timolol (COMBIGAN) 0.2-0.5 % ophthalmic solution Place 1 drop into both eyes 2 (two) times daily.     . Cholecalciferol (VITAMIN D-3) 125 MCG (5000 UT) TABS Take 5,000 Units by mouth daily at 12 noon.    . clobetasol ointment (TEMOVATE) 09.37% Apply 1 application topically 2 (two) times daily as needed (skin irritation.).     .Marland Kitchendiltiazem (CARDIZEM CD) 120 MG 24 hr capsule TAKE 1 CAPSULE(120 MG) BY MOUTH DAILY 30 capsule 2  . diphenhydrAMINE (BENADRYL) 25 MG tablet Take 25 mg by mouth 3 (three) times daily as needed for allergies.     .Marland KitchenELIQUIS 5 MG TABS tablet TAKE 1 TABLET(5 MG) BY MOUTH TWICE DAILY 60 tablet 0  . lisinopril (ZESTRIL) 40 MG tablet TAKE 1 TABLET(40 MG) BY MOUTH DAILY 90 tablet 1  . loratadine (CLARITIN) 10 MG tablet Take 10 mg by mouth daily.    . Misc Natural Products (GLUCOSAMINE CHOND MSM FORMULA) TABS Take 1 tablet by mouth daily at 12 noon.     . Multiple Vitamin (MULTIVITAMIN WITH MINERALS) TABS tablet Take 1 tablet by mouth daily at 12 noon.    . Multiple Vitamins-Minerals (PRESERVISION AREDS 2 PO) Take 1 tablet by mouth in the morning and at bedtime.     . Omega 3-6-9 Fatty Acids (OMEGA 3-6-9 COMPLEX) CAPS Take 1 tablet by mouth daily at 12 noon.     . Saw  Palmetto 450 MG CAPS Take 450 mg by mouth daily at 12 noon.     . triamcinolone cream (KENALOG) 0.1 % Apply 1 application topically as needed (Hand for eczema). Hand for eczema    . Zinc 50 MG TABS Take 50 mg by mouth daily at 12 noon.     No current facility-administered medications on file prior to visit.    Cardiovascular and other pertinent studies:  EKG 03/22/2020: Atrial fibrillation 83 bpm   Unsuccessful cardioversion attempt 03/14/2020  EKG 03/01/2020: Atrial fibrillation 83 bpm  Echocardiogram 10/05/2019:  Left ventricle cavity is normal in size. Normal LV systolic function with  EF 55%. Normal global wall motion. Moderate concentric hypertrophy of the  left ventricle. Doppler evidence of grade I (impaired) diastolic  dysfunction, normal LAP.  Left atrial cavity is moderately dilated.  Trileaflet aortic valve. Moderate (Grade II) aortic regurgitation.  Mild (Grade I) mitral regurgitation.  Mild pulmonic regurgitation.  IVC is dilated with respiratory variation. Estimated RA pressure 8 mmHg.  Compared to previous study on 05/19/2019, EF has improved from 35-40% to  55%. Rhythm on single monitoring lead now appears to be sinus.   EKG 08/06/2019: Sinus rhythm 54 bpm. Isolated lateral T wave inversion, probably nonspecific.   Direct current cardioversion 07/27/2019: Indication symptomatic A. Fibrillation. Procedure: Using 130  mg of IV Propofol and 50 IV Lidocaine (for reducing venous pain) for achieving deep sedation, synchronized direct current cardioversion performed. Patient was delivered with 1560x1, 200 Joules of electricity X 3 with success to NSR. Patient tolerated the procedure well. No immediate complication noted.  Also received 5 mg lopressor IV prior to procedure.    EKG 07/01/2019: Atrial fibrillation 91 bpm    Recent labs: 11/19/2019: Glucose 84, BUN/Cr 17/0.76. EGFR 99. Na/K 140/4.4.    04/19/2019: Glucose 83, BUN/Cr 13/0.78. EGFR normal. Na/K 140/4.2.  Rest of the CMP normal H/H 15/43. MCV 91. Platelets 203    Review of Systems  Cardiovascular: Negative for chest pain, dyspnea on exertion, leg swelling, palpitations and syncope.       Vitals:   06/22/20 1038  BP: 111/72  Pulse: 90  Resp: 16  Temp: 97.7 F (36.5 C)  SpO2: 99%     Body mass index is 29.76 kg/m. Filed Weights   06/22/20 1038  Weight: 219 lb 6.4 oz (99.5 kg)     Objective:   Physical Exam Vitals and nursing note reviewed.  Constitutional:      Appearance: He is well-developed.  Neck:     Vascular: No JVD.  Cardiovascular:     Rate and Rhythm: Normal rate. Rhythm irregular.     Pulses: Intact distal pulses.     Heart sounds: Normal heart sounds. No murmur heard.   Pulmonary:     Effort: Pulmonary effort is normal.     Breath sounds: Normal breath sounds. No wheezing or rales.         Assessment & Recommendations:   60 y.o. Caucsian male with hypertension, paroxysmal atrial fibrillation, now with recovered EF  Persistent l atrial fibrillation: Unsuccessful cardioversion attempt on 03/14/2020. Remains in rate controlled Afib without any symptoms. Tolerating diltiazem 120 mg daily. CHA2DS2-VASc score is 2, given prior history of heart failure, with annual stroke risk for 2%.  Continue Eliquis 5 mg twice daily.  Given his prior heart failure, although now with recovered EF, I am rather reluctant to use antiarrhythmic medications other than Tikosyn and amiodarone.  Risk profile of amiodarone makes it less favorable Dr. Use in a 60 year old patient.  Tikosyn will require hospitalization, which patient does not want to consider at this point.  For the same reason, also not considering ablation.  Overall, he is relatively asymptomatic and rate controlled with his A. fib.  We have mutually decided to continue rate control approach. Recommend sleep study evaluation given concern for OSA. Echocardiogram pending  Hypertension: Well controlled  F/u  in 6 months  Manish Esther Hardy, MD [facility] Pager: [phone] Office: [phone]

## 2020-06-26 ENCOUNTER — Other Ambulatory Visit: Payer: 59

## 2020-06-27 ENCOUNTER — Other Ambulatory Visit: Payer: Self-pay

## 2020-06-27 ENCOUNTER — Ambulatory Visit: Payer: 59

## 2020-06-27 DIAGNOSIS — I4819 Other persistent atrial fibrillation: Secondary | ICD-10-CM

## 2020-06-27 DIAGNOSIS — I10 Essential (primary) hypertension: Secondary | ICD-10-CM

## 2020-07-03 ENCOUNTER — Encounter: Payer: Self-pay | Admitting: Neurology

## 2020-07-03 ENCOUNTER — Other Ambulatory Visit: Payer: Self-pay

## 2020-07-03 ENCOUNTER — Ambulatory Visit: Payer: 59 | Admitting: Neurology

## 2020-07-03 VITALS — BP 124/84 | HR 94 | Ht 72.0 in | Wt 220.3 lb

## 2020-07-03 DIAGNOSIS — G4733 Obstructive sleep apnea (adult) (pediatric): Secondary | ICD-10-CM | POA: Diagnosis not present

## 2020-07-03 DIAGNOSIS — Z9989 Dependence on other enabling machines and devices: Secondary | ICD-10-CM | POA: Diagnosis not present

## 2020-07-03 NOTE — Patient Instructions (Signed)
It was good to see you again today.  I am glad to hear that you are doing well with your AutoPap machine.  You have certainly been fully compliant with treatment.  As discussed, your air leakage has increased ever so slightly in the past 10 or 12 days.  Please monitor for air leakage, see if you need to readjust your headgear and you can always talk to [facility] about troubleshooting the air leakage.  Please continue using your autoPAP regularly. While your insurance requires that you use PAP at least 4 hours each night on 70% of the nights, I recommend, that you not skip any nights and use it throughout the night if you can. Getting used to PAP and staying with the treatment long term does take time and patience and discipline. Untreated obstructive sleep apnea when it is moderate to severe can have an adverse impact on cardiovascular health and raise her risk for heart disease, arrhythmias, hypertension, congestive heart failure, stroke and diabetes. Untreated obstructive sleep apnea causes sleep disruption, nonrestorative sleep, and sleep deprivation. This can have an impact on your day to day functioning and cause daytime sleepiness and impairment of cognitive function, memory loss, mood disturbance, and problems focussing. Using PAP regularly can improve these symptoms.  Please follow-up routinely in 1 year to see one of our nurse practitioners.

## 2020-07-03 NOTE — Progress Notes (Signed)
Subjective:    Patient ID: Dylan Wilkerson is a 60 y.o. male.  HPI     Interim history:   Dylan Wilkerson is a 60 year old right-handed gentleman with an underlying medical history of seasonal allergies, kidney lesion, eczema, A. fib with status post cardioversion, and overweight state, who Presents for follow-up consultation of his obstructive sleep apnea continue home sleep testing and starting AutoPap therapy.  The patient is unaccompanied today.  I first met him before on 09/07/2018, with his cardiologist, at which time he reported a prior diagnosis of sleep apnea.  He had been on CPAP therapy but had lost weight.  He was encouraged to seek reevaluation.  He had a home sleep test on 03/20/2020 which indicated moderate obstructive sleep apnea by number of events with an AHI of 27.7/h, O2 nadir was 90%.  Given his history of A. fib he was advised to start AutoPap therapy. Set up date was 05/16/2020.  Today, 07/03/2020: I reviewed his AutoPap compliance data from 05/30/2020 through 06/28/2020, which is a total of 30 days, during which time he used his machine every night with percent use days greater than 4 hours at 100%, indicating superb compliance with an average usage of 8 hours and 9 minutes, residual AHI at goal at 2.2/h, average pressure for the 95th percentile at 10.6 cm with a range of 6 to 12 cm with EPR, leak on the high side lately especially in the past 10 days with a 95th percentile at 24.2 L/min.  He reports having adjusted well to treatment.  He is fully compliant with it.  He reports using a nasal cradle interface.  He has slept better.  He feels that AutoPap has been helpful.  He is committed to using it.  He had a recent checkup with his cardiologist.  He exercises regularly.  He has met with a nutritionist.  He is working on weight loss.  The patient's allergies, current medications, family history, past medical history, past social history, past surgical history and problem list were  reviewed and updated as appropriate.   Previously:   09/07/19: (He) reports a prior diagnosis of obstructive sleep apnea several years ago.  Sleep study testing was through ENT, Dr. Erik Obey at the time, probably 15 or 20 years ago he recalls.  As he recalls, he had significant sleep apnea at the time and was on a CPAP machine but was able to lose quite a bit of weight.  He no longer uses CPAP therapy but has not had a sleep study in years.  He is single, lives alone, does not know if snores currently.  His Epworth sleepiness score is 1 out of 24, fatigue severity score is 16 out of 63.  He does not have a family history of sleep apnea.  He tries to stay active, walks about 5 miles per day, has worked on stress reduction and practices yoga on a regular basis.  He has never had his tonsils out. I reviewed your office note from 08/06/2019.  He has reduced his caffeine intake since his A. fib diagnosis.  He goes to bed between 10 and 11 and rise time is currently around 8.  He is retired as an Chief Financial Officer working for [employer] but would like to find some work in [employer] sector now for a few years.  He denies any morning headaches.  He has nocturia about once per average night.  He has no pets in the house.  He has  a TV in the bedroom but turns it off before falling asleep.  He is a non-smoker and drinks alcohol occasionally.  His Past Medical History Is Significant For: Past Medical History:  Diagnosis Date  . A-fib ([place])   . Cardiomyopathy ([place])   . Eczema   . Kidney lesion    "has been cleared"  . Seasonal allergies     His Past Surgical History Is Significant For: Past Surgical History:  Procedure Laterality Date  . CARDIOVERSION N/A 07/27/2019   Procedure: CARDIOVERSION;  Surgeon: Adrian Prows, MD;  Location: [facility];  Service: Cardiovascular;  Laterality: N/A;  . CARDIOVERSION N/A 03/14/2020   Procedure: CARDIOVERSION;  Surgeon: Nigel Mormon, MD;  Location: [facility];   Service: Cardiovascular;  Laterality: N/A;  . CATARACT EXTRACTION Bilateral    age 2 right eye, age 75 lft eye    His Family History Is Significant For: Family History  Problem Relation Age of Onset  . Dementia Father   . Prostate cancer Father     His Social History Is Significant For: Social History   Socioeconomic History  . Marital status: Single    Spouse name: Not on file  . Number of children: 0  . Years of education: Not on file  . Highest education level: Not on file  Occupational History  . Not on file  Tobacco Use  . Smoking status: Never Smoker  . Smokeless tobacco: Never Used  Vaping Use  . Vaping Use: Never used  Substance and Sexual Activity  . Alcohol use: No  . Drug use: Never  . Sexual activity: Not on file  Other Topics Concern  . Not on file  Social History Narrative  . Not on file   Social Determinants of Health   Financial Resource Strain: Not on file  Food Insecurity: Not on file  Transportation Needs: Not on file  Physical Activity: Not on file  Stress: Not on file  Social Connections: Not on file    His Allergies Are:  Allergies  Allergen Reactions  . Dairy Aid [Lactase] Other (See Comments)    Gi upset  :   His Current Medications Are:  Outpatient Encounter Medications as of 07/03/2020  Medication Sig  . Bioflavonoid Products (ESTER-C) TABS Take 1 tablet by mouth daily. 1076m  . brimonidine-timolol (COMBIGAN) 0.2-0.5 % ophthalmic solution Place 1 drop into both eyes 2 (two) times daily.   . Cholecalciferol (VITAMIN D-3) 125 MCG (5000 UT) TABS Take 5,000 Units by mouth daily at 12 noon.  . clobetasol ointment (TEMOVATE) 04.66% Apply 1 application topically 2 (two) times daily as needed (skin irritation.).   .Marland Kitchendiltiazem (CARDIZEM CD) 120 MG 24 hr capsule TAKE 1 CAPSULE(120 MG) BY MOUTH DAILY  . diphenhydrAMINE (BENADRYL) 25 MG tablet Take 25 mg by mouth 3 (three) times daily as needed for allergies.   .Marland KitchenELIQUIS 5 MG TABS tablet  TAKE 1 TABLET(5 MG) BY MOUTH TWICE DAILY  . lisinopril (ZESTRIL) 40 MG tablet TAKE 1 TABLET(40 MG) BY MOUTH DAILY  . loratadine (CLARITIN) 10 MG tablet Take 10 mg by mouth daily.  . Misc Natural Products (GLUCOSAMINE CHOND MSM FORMULA) TABS Take 1 tablet by mouth daily at 12 noon.   . Multiple Vitamin (MULTIVITAMIN WITH MINERALS) TABS tablet Take 1 tablet by mouth daily at 12 noon.  . Multiple Vitamins-Minerals (PRESERVISION AREDS 2 PO) Take 1 tablet by mouth in the morning and at bedtime.   . Omega 3-6-9 Fatty Acids (OMEGA 3-6-9  COMPLEX) CAPS Take 1 tablet by mouth daily at 12 noon.   . Saw Palmetto 450 MG CAPS Take 450 mg by mouth daily at 12 noon.   . triamcinolone cream (KENALOG) 0.1 % Apply 1 application topically as needed (Hand for eczema). Hand for eczema  . Zinc 50 MG TABS Take 50 mg by mouth daily at 12 noon.   No facility-administered encounter medications on file as of 07/03/2020.  :  Review of Systems:  Out of a complete 14 point review of systems, all are reviewed and negative with the exception of these symptoms as listed below: He feels a little better.  He has established with AutoPap therapy and has adapted well to treatment.  Physical Examination:   Vitals:   07/03/20 1404  BP: 124/84  Pulse: 94  SpO2: 98%    General Examination: The patient is a very pleasant 60 y.o. male in no acute distress. He appears well-developed and well-nourished and well groomed.   HEENT: Normocephalic, atraumatic, status post bilateral cataract repairs, corrective eyeglasses in place.  Extraocular tracking is preserved, hearing is grossly intact, face is symmetric with normal facial animation, speech is clear with no dysarthria or hypophonia.  No lip, neck or jaw tremor.  Airway examination reveals moderate mouth dryness, tongue protrudes centrally in palate elevates symmetrically, small airway noted.   Chest: Clear to auscultation without wheezing, rhonchi or crackles noted.  Heart:  S1+S2+0, slightly irregular.    Abdomen: Soft, non-tender and non-distended.  Extremities: There is no  obvious edema in the distal legs.     Skin: Warm and dry without trophic changes noted.   Musculoskeletal: exam reveals no obvious joint deformities, tenderness or joint swelling or erythema.   Neurologically:  Mental status: The patient is awake, alert and oriented in all 4 spheres. His immediate and remote memory, attention, language skills and fund of knowledge are appropriate. There is no evidence of aphasia, agnosia, apraxia or anomia. Speech is clear with normal prosody and enunciation. Thought process is linear. Mood is normal and affect is normal.  Cranial nerves II - XII are as described above under HEENT exam.  Motor exam: Normal bulk, strength and tone is noted. There is no tremor, fine motor skills and coordination: grossly intact.  Cerebellar testing: No dysmetria or intention tremor. There is no truncal or gait ataxia.  Sensory exam: intact to light touch in the upper and lower extremities.  Gait, station and balance: He stands easily. No veering to one side is noted. No leaning to one side is noted. Posture is age-appropriate and stance is narrow based. Gait shows normal stride length and normal pace. No problems turning are noted.  Assessment and Plan:  In summary, Dylan Wilkerson is a very pleasant 60 year old male with an underlying medical history of seasonal allergies, kidney lesion, eczema, A. fib, and overweight state, who presents for follow-up consultation of his obstructive sleep apnea.  He carries a prior diagnosis of sleep apnea, recent home sleep testing on 03/28/2020 showed moderate obstructive sleep apnea by number of events, O2 nadir was 90%, AHI 27.7/h.  He has established AutoPap therapy since 05/09/2020 and is fully compliant with treatment.  He indicates good results and improvement of his sleep quality.  He is committed to continuing with treatment.  He  is commended for his treatment adherence.  Lately, his leak has increased slightly compared to the weeks before.  He is advised to monitor and see if he needs to tighten the  headgear just a little bit.  He is also advised to seek troubleshooting with his DME provider if the need arises.  He is working on weight loss.  He is exercising on a regular basis, he appears to be in persistent A. fib, rate controlled.   At this juncture, he is advised to follow-up routinely in sleep clinic to see one of our nurse practitioners for checkup in 1 year.  I answered all his questions today and he was in agreement.   I spent 30 minutes in total face-to-face time and in reviewing records during pre-charting, more than 50% of which was spent in counseling and coordination of care, reviewing test results, reviewing medications and treatment regimen and/or in discussing or reviewing the diagnosis of OSA, the prognosis and treatment options. Pertinent laboratory and imaging test results that were available during this visit with the patient were reviewed by me and considered in my medical decision making (see chart for details).

## 2020-08-30 IMAGING — US US RENAL
1 series · 14 of 25 positions shown · non-contrast
Comparison: 05/04/2014

CLINICAL DATA: RIGHT renal cyst

EXAM:
RENAL / URINARY TRACT ULTRASOUND COMPLETE

[Series 1: us renal · 0.25mm/px · 14 of 45 slices shown]
[im 1/45]
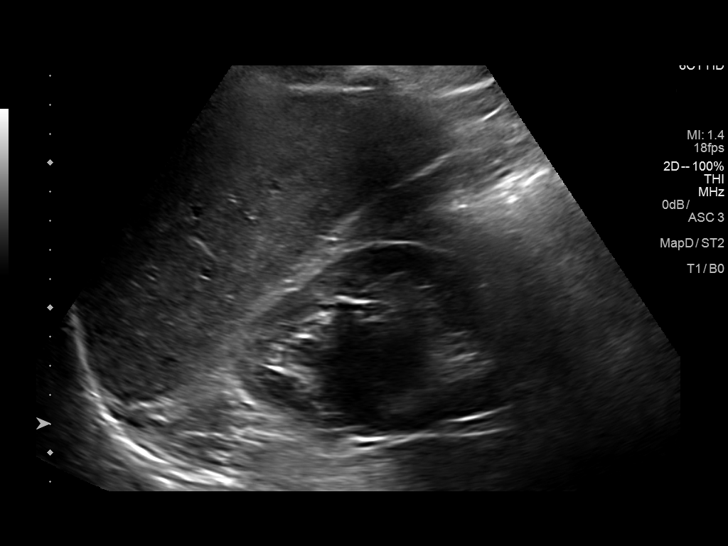
[im 4/45]
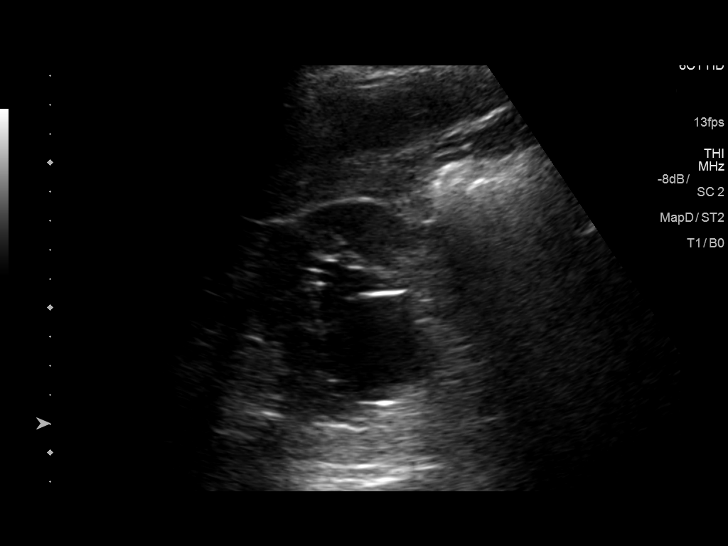
[im 8/45]
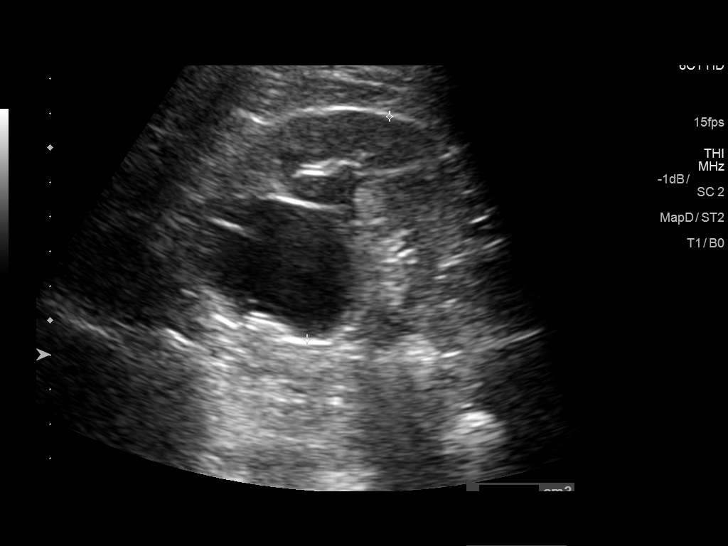
[im 12/45]
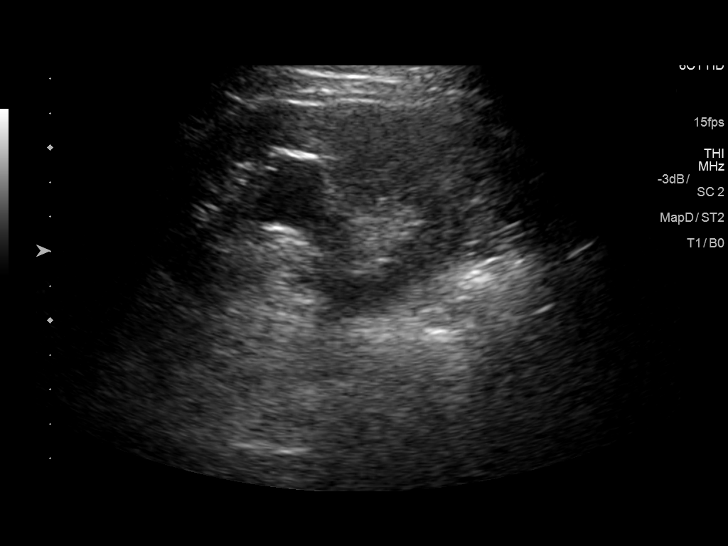
[im 15/45]
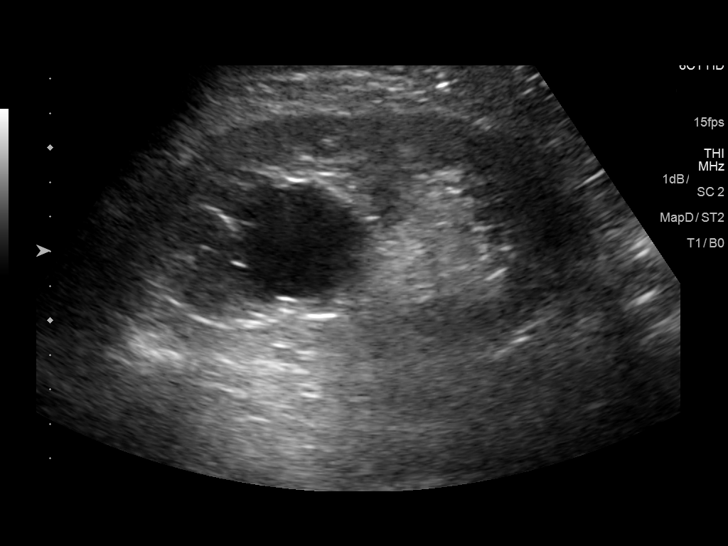
[im 17/45]
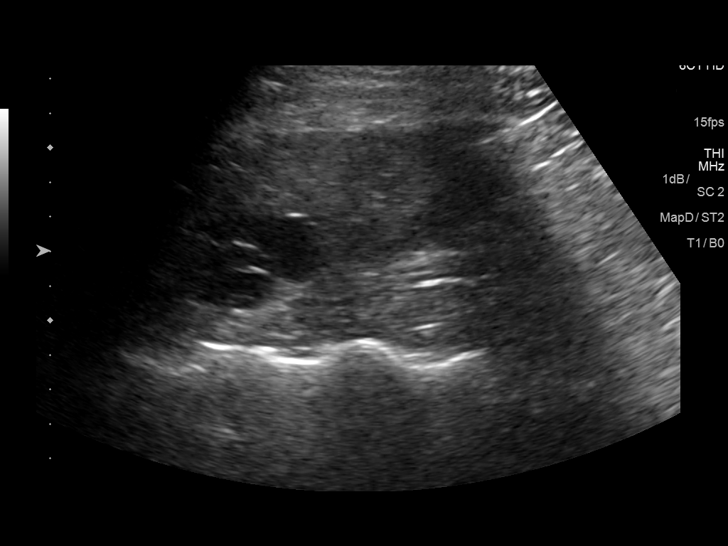
[im 21/45]
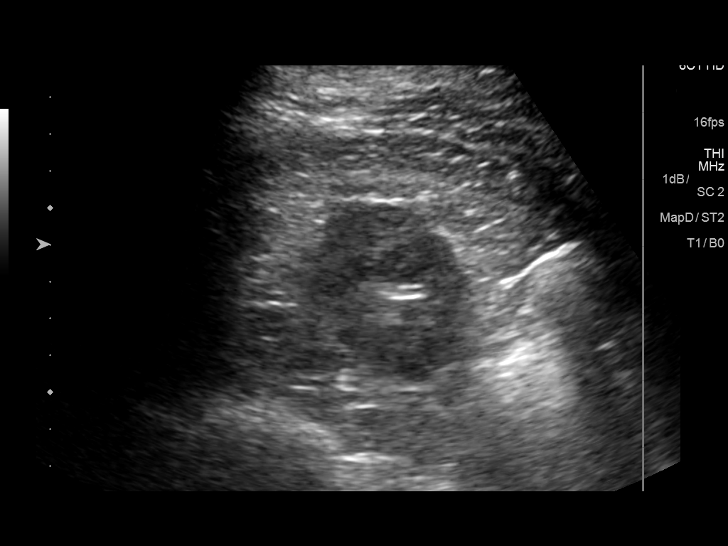
[im 24/45]
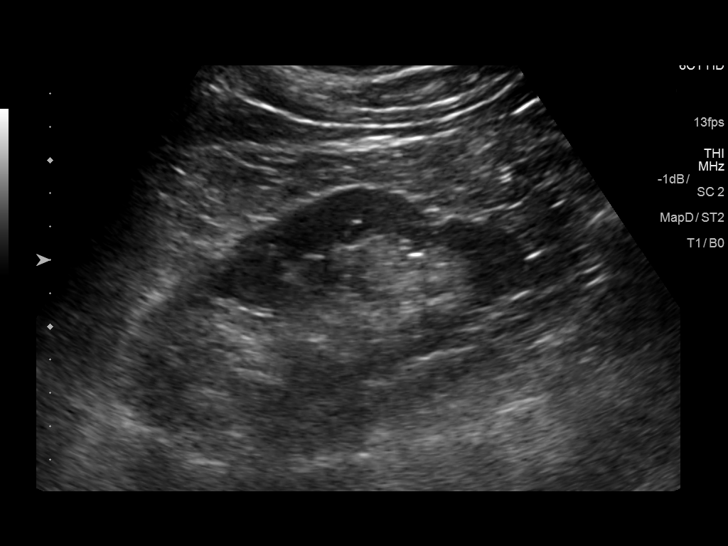
[im 28/45]
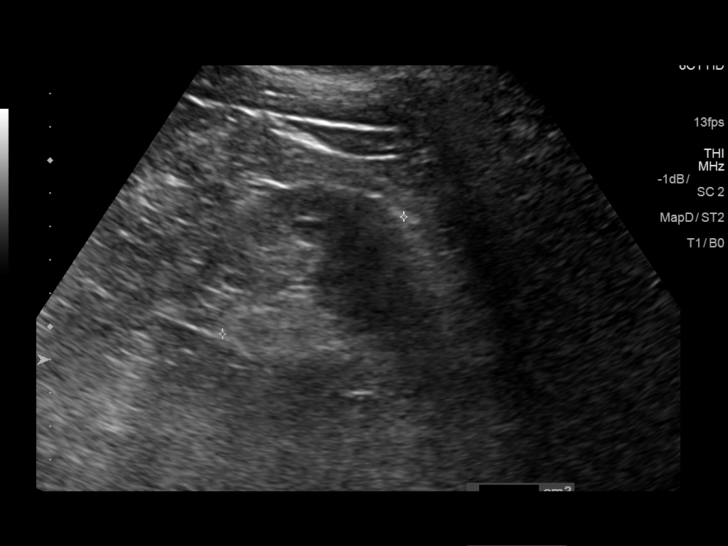
[im 30/45]
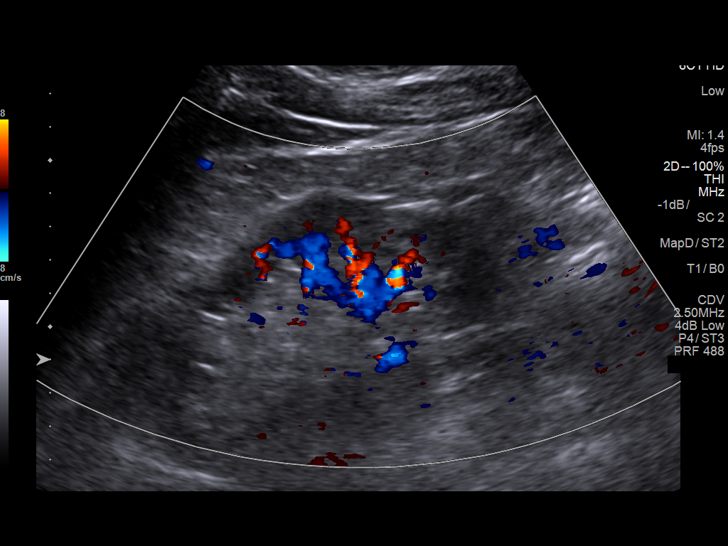
[im 34/45]
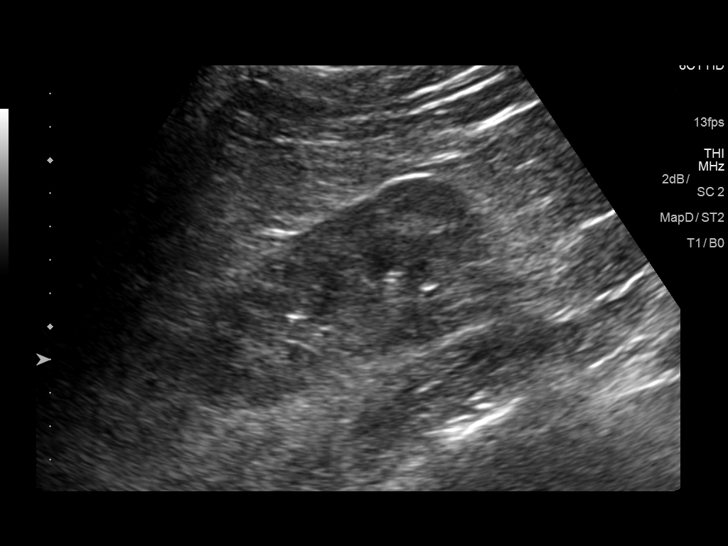
[im 37/45]
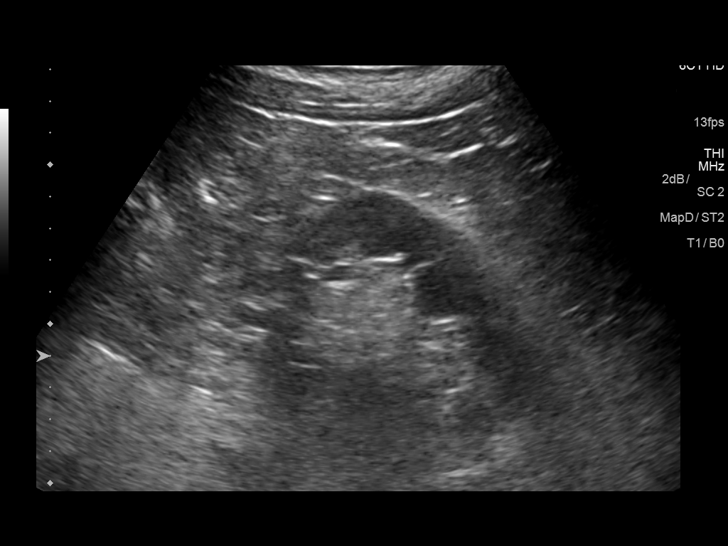
[im 41/45]
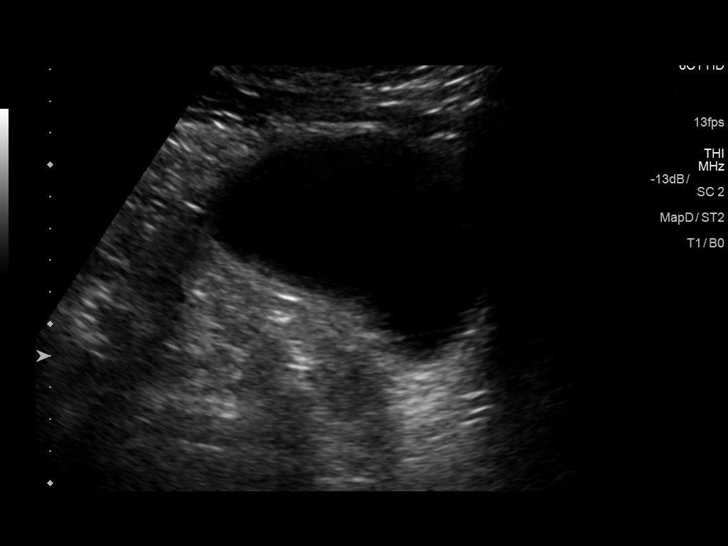
[im 45/45]
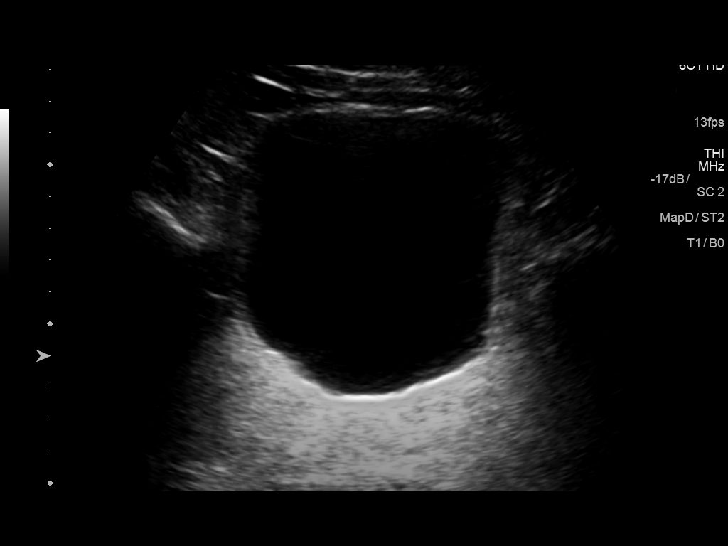

[14 of 25 positions shown; findings below may reference images not displayed]

FINDINGS: Right Kidney:

Renal measurements: 12.5 x 6.9 x 6.9 cm = volume: 312.2 mL. Mild
cortical thinning. Increased cortical echogenicity. Peripelvic cyst
at upper to mid kidney 3.0 x 3.3 x 3.5 cm, containing a few
scattered internal echoes, previously 3.4 x 2.7 x 3.2 cm. Additional
small cyst at upper pole 2.2 x 1.9 x 2.4 cm. No hydronephrosis.

Left Kidney:

Renal measurements: 12.3 x 6.4 x 6.5 cm = volume: 266.9 mL. Cortical
thinning. Minimally increased cortical echogenicity. No mass,
hydronephrosis or shadowing calcifications.

Bladder:

Appears normal for degree of bladder distention.
IMPRESSION: Mild medical renal disease changes of both kidneys.

Two RIGHT renal cysts are identified, peripelvic cyst 3.5 cm
greatest size and small upper pole cyst 2.4 cm.

No other renal sonographic abnormalities identified.

## 2020-08-31 ENCOUNTER — Other Ambulatory Visit: Payer: Self-pay | Admitting: Cardiology

## 2020-11-09 ENCOUNTER — Other Ambulatory Visit: Payer: Self-pay | Admitting: Cardiology

## 2020-11-09 DIAGNOSIS — I4819 Other persistent atrial fibrillation: Secondary | ICD-10-CM

## 2020-11-22 ENCOUNTER — Other Ambulatory Visit: Payer: Self-pay | Admitting: Cardiology

## 2020-12-06 ENCOUNTER — Other Ambulatory Visit: Payer: Self-pay | Admitting: Cardiology

## 2020-12-06 DIAGNOSIS — I4819 Other persistent atrial fibrillation: Secondary | ICD-10-CM

## 2020-12-19 ENCOUNTER — Other Ambulatory Visit: Payer: Self-pay | Admitting: Cardiology

## 2020-12-19 DIAGNOSIS — I10 Essential (primary) hypertension: Secondary | ICD-10-CM

## 2020-12-20 ENCOUNTER — Ambulatory Visit: Payer: 59 | Admitting: Cardiology

## 2020-12-20 ENCOUNTER — Other Ambulatory Visit: Payer: Self-pay

## 2020-12-20 ENCOUNTER — Encounter: Payer: Self-pay | Admitting: Cardiology

## 2020-12-20 VITALS — BP 129/87 | HR 92 | Temp 98.6°F | Resp 16 | Ht 72.0 in | Wt 223.0 lb

## 2020-12-20 DIAGNOSIS — I4819 Other persistent atrial fibrillation: Secondary | ICD-10-CM

## 2020-12-20 DIAGNOSIS — R0789 Other chest pain: Secondary | ICD-10-CM

## 2020-12-20 NOTE — Progress Notes (Signed)
Patient referred by Josetta Huddle, MD for atrial fibrillation  Subjective:   Dylan Wilkerson, male    DOB: 19-Nov-1960, 60 y.o.   MRN: [id number]   Chief Complaint  Patient presents with   Atrial Fibrillation   Hypertension   Follow-up    6 month     HPI  60 y.o. Caucasian male with hypertension, paroxysmal atrial fibrillation, now with recovered EF  Patient is doing well with physical activity, walks 2-1/2 miles every day.  With this level of activity, he denies any chest pain or shortness of breath symptoms.  He does have occasional chest tightness episodes that seem to occur at rest, last for up to an hour or so.   Current Outpatient Medications on File Prior to Visit  Medication Sig Dispense Refill   Bioflavonoid Products (ESTER-C) TABS Take 1 tablet by mouth daily. 1045m     brimonidine-timolol (COMBIGAN) 0.2-0.5 % ophthalmic solution Place 1 drop into both eyes 2 (two) times daily.      Cholecalciferol (VITAMIN D-3) 125 MCG (5000 UT) TABS Take 5,000 Units by mouth daily at 12 noon.     clobetasol ointment (TEMOVATE) 02.09% Apply 1 application topically 2 (two) times daily as needed (skin irritation.).      diltiazem (CARDIZEM CD) 120 MG 24 hr capsule TAKE 1 CAPSULE(120 MG) BY MOUTH DAILY 30 capsule 2   diphenhydrAMINE (BENADRYL) 25 MG tablet Take 25 mg by mouth 3 (three) times daily as needed for allergies.      ELIQUIS 5 MG TABS tablet TAKE 1 TABLET(5 MG) BY MOUTH TWICE DAILY 60 tablet 3   lisinopril (ZESTRIL) 40 MG tablet TAKE 1 TABLET(40 MG) BY MOUTH DAILY 90 tablet 1   loratadine (CLARITIN) 10 MG tablet Take 10 mg by mouth daily.     Misc Natural Products (GLUCOSAMINE CHOND MSM FORMULA) TABS Take 1 tablet by mouth daily at 12 noon.      Multiple Vitamin (MULTIVITAMIN WITH MINERALS) TABS tablet Take 1 tablet by mouth daily at 12 noon.     Multiple Vitamins-Minerals (PRESERVISION AREDS 2 PO) Take 1 tablet by mouth in the morning and at bedtime.      Omega 3-6-9  Fatty Acids (OMEGA 3-6-9 COMPLEX) CAPS Take 1 tablet by mouth daily at 12 noon.      Saw Palmetto 450 MG CAPS Take 450 mg by mouth daily at 12 noon.      triamcinolone cream (KENALOG) 0.1 % Apply 1 application topically as needed (Hand for eczema). Hand for eczema     Zinc 50 MG TABS Take 50 mg by mouth daily at 12 noon.     No current facility-administered medications on file prior to visit.    Cardiovascular and other pertinent studies:  EKG 12/20/2020:  Atrial fibrillation 88 bpm  Echocardiogram 06/27/2020:  Normal LV systolic function with visual EF 50-55%. Left ventricle cavity  is normal in size. Normal global wall motion. Unable to evaluate diastolic  function due to atrial fibrillation. Calculated EF 51%.  Moderate (Grade II) aortic regurgitation.  Moderate (Grade II) mitral regurgitation.  Mild tricuspid regurgitation. No evidence of pulmonary hypertension.  Mild pulmonic regurgitation.  Compared to prior study dated 10/05/2019: Unable to quantify diastolic  dysfunction due to atrial fibrillation. AR and PR are stable. Mild MR is  now Moderate MR.   Unsuccessful cardioversion attempt 03/14/2020  Direct current cardioversion 07/27/2019: Indication symptomatic A. Fibrillation. Procedure: Using 130 mg of IV Propofol and 50 IV Lidocaine (for reducing  venous pain) for achieving deep sedation, synchronized direct current cardioversion performed. Patient was delivered with 1560x1, 200 Joules of electricity X 3 with success to NSR. Patient tolerated the procedure well. No immediate complication noted.  Also received 5 mg lopressor IV prior to procedure.     Recent labs: 03/08/2020: Glucose 73, BUN/Cr 12/0.79. EGFR 98. Na/K 141/4.6.   11/19/2019: Glucose 84, BUN/Cr 17/0.76. EGFR 99. Na/K 140/4.4.    04/19/2019: Glucose 83, BUN/Cr 13/0.78. EGFR normal. Na/K 140/4.2. Rest of the CMP normal H/H 15/43. MCV 91. Platelets 203    Review of Systems  Cardiovascular:  Positive for chest  pain. Negative for dyspnea on exertion, leg swelling, palpitations and syncope.      Vitals:   12/20/20 1434  BP: 129/87  Pulse: 92  Resp: 16  Temp: 98.6 F (37 C)  SpO2: 98%     Body mass index is 30.24 kg/m. Filed Weights   12/20/20 1434  Weight: 223 lb (101.2 kg)     Objective:   Physical Exam Vitals and nursing note reviewed.  Constitutional:      General: He is not in acute distress. Neck:     Vascular: No JVD.  Cardiovascular:     Rate and Rhythm: Normal rate. Rhythm irregular.     Heart sounds: Normal heart sounds. No murmur heard. Pulmonary:     Effort: Pulmonary effort is normal.     Breath sounds: Normal breath sounds. No wheezing or rales.  Musculoskeletal:     Right lower leg: No edema.     Left lower leg: No edema.        Assessment & Recommendations:   60 y.o. Caucsian male with hypertension, paroxysmal atrial fibrillation, now with recovered EF  Atypical chest pain: Recommend exercise nuclear stress test.  Persistent l atrial fibrillation: Unsuccessful cardioversion attempt on 03/14/2020. Remains in rate controlled Afib without any symptoms. Tolerating diltiazem 120 mg daily. CHA2DS2-VASc score is 2, given prior history of heart failure, with annual stroke risk for 2%.  Continue Eliquis 5 mg twice daily.  Given his prior heart failure, although now with recovered EF, I am rather reluctant to use antiarrhythmic medications other than Tikosyn and amiodarone.  Risk profile of amiodarone makes it less favorable Dr. Use in a 60 year old patient.  Tikosyn will require hospitalization, which patient does not want to consider at this point.  For the same reason, also not considering ablation.  Overall, he is relatively asymptomatic and rate controlled with his A. fib.  We have mutually decided to continue rate control approach. Recommend sleep study evaluation given concern for OSA.  Hypertension: Well controlled  F/u in 6 months, unless any  significant abnormalities noted on stress testing  Nigel Mormon, MD [facility] Pager: [phone] Office: [phone]

## 2021-01-02 ENCOUNTER — Telehealth: Payer: Self-pay

## 2021-01-02 DIAGNOSIS — G459 Transient cerebral ischemic attack, unspecified: Secondary | ICD-10-CM

## 2021-01-02 NOTE — Telephone Encounter (Signed)
Patient called and stated that his entire left side had went numb for about 30-40 min. His arm was totally limp and has since gone away and has been fine. He just wanted you to know, in case you wanted him to come in before his stress test. Patient stated that he is currently doing fine and has not had any others problems since Friday. Patient denies any swelling, and has been taking all current medications as directed.

## 2021-01-03 ENCOUNTER — Telehealth: Payer: Self-pay | Admitting: Neurology

## 2021-01-03 NOTE — Telephone Encounter (Signed)
It appears that patient had TIA. Needs STAT Neurology consult. If not available, needs stat CT head, CTA head/neck. Please keep me posted.  Thanks MJP

## 2021-01-03 NOTE — Telephone Encounter (Signed)
Per Dr. Virgina Jock Note  It appears that patient had TIA. Needs STAT Neurology consult. If not available, needs stat CT head, CTA head/neck. Please keep me posted.  Thanks MJP

## 2021-01-03 NOTE — Telephone Encounter (Signed)
Thank you, nothing further needed.  

## 2021-01-03 NOTE — Telephone Encounter (Signed)
Received an urgent referral for this patient for probable TIA. Per review of chart by Dr. Honor Loh patient needs to be evaluated immediately in the emergency department as they can do scans urgently. Attempted to call patient to let him know of recommendation-I was unable to leave a voicemail as the mailbox was full. Called referring office to advise them of the same and they put a page in with the on call doctor. we will get patient scheduled for a consult here as well, but patient does need to go to the ER. If patient calls back please advise of the above per Dr. Rexene Alberts.

## 2021-01-04 ENCOUNTER — Telehealth: Payer: Self-pay

## 2021-01-04 NOTE — Telephone Encounter (Signed)
Noted, thank you

## 2021-01-04 NOTE — Telephone Encounter (Signed)
Good afternoon, Dylan Cruel, PA wanted to me let you know that she called and spoke with the patient. Patient has refused to go to the emergency room. Our office has ordered CT testing and wanted to make sure that your office will be in contact with him to make a sooner appointment.  Thank you.

## 2021-01-04 NOTE — Telephone Encounter (Signed)
Thank you!  Staff, please contact the patient and convey the above recommendations.   Thanks MJP

## 2021-01-04 NOTE — Telephone Encounter (Signed)
ON-CALL CARDIOLOGY 01/03/2021   Patient's name: Dylan Wilkerson.   MRN: [id number].    DOB: 03/30/1961 Primary care provider: Josetta Huddle, MD. Primary cardiologist: Dr. Jorge Ny regarding this patient's care today: Received call from neurology office who upon review of stat referral recommended patient be evaluated in the emergency department.  I called patient to follow-up.  Patient has had no recurrence of left hand numbness or other symptoms suggestive of TIA/CVA since Monday.  Advised him regarding recommendations from neurologist office, however patient is resistant to evaluation in the emergency department.  Discussed with him risks versus benefits of undergoing urgent evaluation in the ED, however he opted to proceed with outpatient evaluation instead.  Impression:   ICD-10-CM   1. TIA (transient ischemic attack)  G45.9 CT ANGIO HEAD W OR WO CONTRAST    CT ANGIO NECK W OR WO CONTRAST    CT HEAD WO CONTRAST (5MM)      No orders of the defined types were placed in this encounter.   Orders Placed This Encounter  Procedures   CT ANGIO HEAD W OR WO CONTRAST   CT ANGIO NECK W OR WO CONTRAST   CT HEAD WO CONTRAST (5MM)    Recommendations: Will obtain stat CT of the head as well as CTA of the head and neck.  Patient is agreeable to this.  Also recommend patient keep urgent appointment with neurology.  Telephone encounter total time: 12 minutes     Alethia Berthold, PA-C 01/04/2021, 3:19 PM Office: [phone]

## 2021-01-07 ENCOUNTER — Encounter: Payer: Self-pay | Admitting: Neurology

## 2021-01-08 ENCOUNTER — Telehealth: Payer: Self-pay | Admitting: Neurology

## 2021-01-08 ENCOUNTER — Ambulatory Visit: Payer: 59 | Admitting: Neurology

## 2021-01-08 NOTE — Telephone Encounter (Signed)
NP appointment cancelled due to MD out sick, VM box full, mychart msg sent.

## 2021-01-09 ENCOUNTER — Ambulatory Visit: Payer: 59 | Admitting: Neurology

## 2021-01-09 ENCOUNTER — Encounter: Payer: Self-pay | Admitting: Neurology

## 2021-01-09 VITALS — BP 126/87 | HR 88 | Ht 72.0 in | Wt 233.0 lb

## 2021-01-09 DIAGNOSIS — R29818 Other symptoms and signs involving the nervous system: Secondary | ICD-10-CM

## 2021-01-09 DIAGNOSIS — G459 Transient cerebral ischemic attack, unspecified: Secondary | ICD-10-CM

## 2021-01-09 DIAGNOSIS — Z9989 Dependence on other enabling machines and devices: Secondary | ICD-10-CM

## 2021-01-09 DIAGNOSIS — Z8679 Personal history of other diseases of the circulatory system: Secondary | ICD-10-CM

## 2021-01-09 DIAGNOSIS — G4733 Obstructive sleep apnea (adult) (pediatric): Secondary | ICD-10-CM | POA: Diagnosis not present

## 2021-01-09 NOTE — Patient Instructions (Addendum)
It was nice to see you again today. I am glad to hear that you are feeling well and have not had any additional symptoms since the episode of left-sided numbness with slight weakness noted. We will go ahead and schedule a brain MRI with and without contrast and since you have not had anything to eat or drink today, I would like to proceed with a lipid panel.  We will also check your kidney function prior to proceeding with the MRI of the brain.  Continue exercising regularly and take your medications as directed. As discussed, secondary prevention is key after a stroke or mini stroke/TIA. This means: taking care of blood sugar values or diabetes management (A1c goal of less than 7.0), good blood pressure (hypertension) control and optimizing cholesterol management (with LDL goal of less than 70), exercising daily or regularly within your own mobility limitations of course, and overall cardiovascular risk factor reduction, which includes screening for and treatment of obstructive sleep apnea (OSA) and weight management.   We will call you with your test results.  Please follow-up with your cardiologist as planned.  Please also keep your follow-up appointment as scheduled for February in this office for sleep apnea checkup.  Please continue using your AutoPap regularly.

## 2021-01-09 NOTE — Progress Notes (Signed)
Subjective:    Patient ID: Dylan Wilkerson is a 60 y.o. male.  HPI    Interim history:   Dylan Wilkerson is a 60 year old right-handed gentleman with an underlying medical history of seasonal allergies, kidney lesion, eczema, A. fib with status post cardioversion, and overweight state, who presents for a new problem visit for suspected TIA.  He was advised to make an appointment by his cardiologist.  Patient reports that he had an episode of transient numbness affecting his left arm and his left leg, not his face.  He had done yoga that morning as he usually does.  He has been walking on a regular basis and took his walk as well, walked about 2-1/2 miles.  He went in the shower and as he exited the shower stall, he noticed that he was not moving as easily on the left side, did not frankly feel weak, could still walk okay but felt just a little different and had a numbness affecting his left arm and leg which lasted about 30 minutes.  He called his cardiologist after the weekend.  He did not have any other symptoms such as slurring of speech or headache.  He called his mom and told her that he was not feeling well but did not sound garbled.  He has since then talked to some of his friends that work in healthcare and everybody advised him to have it checked out.  He did not think of going to the ER and decided not to go to the ED as he felt well since then and before then.  He is a non-smoker, he does not indulge in alcohol on a regular basis.  Risk factors for stroke include history of A. fib, obesity, and sleep apnea, he is compliant with his AutoPap and doing well in that regard.  He tries to hydrate well with water.  He has no family history of stroke or A. fib.  He is feeling well and at baseline currently.  He is scheduled for a Myoview test next week and has a CT angiogram of the head and neck scheduled for early September.   I last saw him on 07/03/2020, at which time he was compliant with his new  AutoPap machine.  He had adjusted well to treatment and was working on weight loss.  He has a CT angiogram of the head and neck scheduled for 01/26/2021.  He saw his cardiologist, Dr. Virgina Jock on 12/20/2020.  He called his cardiology office on 01/02/2021 with sudden onset of left-sided numbness that lasted for about 30 to 40 minutes.  When I reviewed the chart on 01/03/2021, the patient was advised to proceed to the emergency room immediately.  The patient did not go to the ER.   The patient's allergies, current medications, family history, past medical history, past social history, past surgical history and problem list were reviewed and updated as appropriate.    Previously:    I first met him before on 09/07/2018, with his cardiologist, at which time he reported a prior diagnosis of sleep apnea.  He had been on CPAP therapy but had lost weight.  He was encouraged to seek reevaluation.  He had a home sleep test on 03/20/2020 which indicated moderate obstructive sleep apnea by number of events with an AHI of 27.7/h, O2 nadir was 90%.  Given his history of A. fib he was advised to start AutoPap therapy. Set up date was 05/16/2020.   I reviewed his AutoPap compliance data from  05/30/2020 through 06/28/2020, which is a total of 30 days, during which time he used his machine every night with percent use days greater than 4 hours at 100%, indicating superb compliance with an average usage of 8 hours and 9 minutes, residual AHI at goal at 2.2/h, average pressure for the 95th percentile at 10.6 cm with a range of 6 to 12 cm with EPR, leak on the high side lately especially in the past 10 days with a 95th percentile at 24.2 L/min.      09/07/19: (He) reports a prior diagnosis of obstructive sleep apnea several years ago.  Sleep study testing was through ENT, Dr. Erik Obey at the time, probably 15 or 20 years ago he recalls.  As he recalls, he had significant sleep apnea at the time and was on a CPAP machine but was able  to lose quite a bit of weight.  He no longer uses CPAP therapy but has not had a sleep study in years.  He is single, lives alone, does not know if snores currently.  His Epworth sleepiness score is 1 out of 24, fatigue severity score is 16 out of 63.  He does not have a family history of sleep apnea.  He tries to stay active, walks about 5 miles per day, has worked on stress reduction and practices yoga on a regular basis.  He has never had his tonsils out. I reviewed your office note from 08/06/2019.  He has reduced his caffeine intake since his A. fib diagnosis.  He goes to bed between 10 and 11 and rise time is currently around 8.  He is retired as an Chief Financial Officer working for [employer] but would like to find some work in [employer] sector now for a few years.  He denies any morning headaches.  He has nocturia about once per average night.  He has no pets in the house.  He has a TV in the bedroom but turns it off before falling asleep.  He is a non-smoker and drinks alcohol occasionally.  His Past Medical History Is Significant For: Past Medical History:  Diagnosis Date   A-fib ([place])    Cardiomyopathy ([place])    Eczema    Kidney lesion    "has been cleared"   Seasonal allergies     His Past Surgical History Is Significant For: Past Surgical History:  Procedure Laterality Date   CARDIOVERSION N/A 07/27/2019   Procedure: CARDIOVERSION;  Surgeon: Adrian Prows, MD;  Location: [facility];  Service: Cardiovascular;  Laterality: N/A;   CARDIOVERSION N/A 03/14/2020   Procedure: CARDIOVERSION;  Surgeon: Nigel Mormon, MD;  Location: [facility];  Service: Cardiovascular;  Laterality: N/A;   CATARACT EXTRACTION Bilateral    age 2 right eye, age 38 lft eye    His Family History Is Significant For: Family History  Problem Relation Age of Onset   Dementia Father    Prostate cancer Father     His Social History Is Significant For: Social History   Socioeconomic History   Marital  status: Single    Spouse name: Not on file   Number of children: 0   Years of education: Not on file   Highest education level: Not on file  Occupational History   Not on file  Tobacco Use   Smoking status: Never   Smokeless tobacco: Never  Vaping Use   Vaping Use: Never used  Substance and Sexual Activity   Alcohol use: No   Drug  use: Never   Sexual activity: Not on file  Other Topics Concern   Not on file  Social History Narrative   Not on file   Social Determinants of Health   Financial Resource Strain: Not on file  Food Insecurity: Not on file  Transportation Needs: Not on file  Physical Activity: Not on file  Stress: Not on file  Social Connections: Not on file    His Allergies Are:  Allergies  Allergen Reactions   Dairy Aid [Lactase] Other (See Comments)    Gi upset  :   His Current Medications Are:  Outpatient Encounter Medications as of 01/09/2021  Medication Sig   Bioflavonoid Products (ESTER-C) TABS Take 1 tablet by mouth daily. $RemoveBefo'1000mg'IbelwmkhLVY$    brimonidine-timolol (COMBIGAN) 0.2-0.5 % ophthalmic solution Place 1 drop into both eyes 2 (two) times daily.    Cholecalciferol (VITAMIN D-3) 125 MCG (5000 UT) TABS Take 5,000 Units by mouth daily at 12 noon.   clobetasol ointment (TEMOVATE) 1.85 % Apply 1 application topically 2 (two) times daily as needed (skin irritation.).    diltiazem (CARDIZEM CD) 120 MG 24 hr capsule TAKE 1 CAPSULE(120 MG) BY MOUTH DAILY   diphenhydrAMINE (BENADRYL) 25 MG tablet Take 25 mg by mouth 3 (three) times daily as needed for allergies.    ELIQUIS 5 MG TABS tablet TAKE 1 TABLET(5 MG) BY MOUTH TWICE DAILY   lisinopril (ZESTRIL) 40 MG tablet TAKE 1 TABLET(40 MG) BY MOUTH DAILY   loratadine (CLARITIN) 10 MG tablet Take 10 mg by mouth daily.   Misc Natural Products (GLUCOSAMINE CHOND MSM FORMULA) TABS Take 1 tablet by mouth daily at 12 noon.    Multiple Vitamin (MULTIVITAMIN WITH MINERALS) TABS tablet Take 1 tablet by mouth daily at 12 noon.    Multiple Vitamins-Minerals (PRESERVISION AREDS 2 PO) Take 1 tablet by mouth in the morning and at bedtime.    Omega 3-6-9 Fatty Acids (OMEGA 3-6-9 COMPLEX) CAPS Take 1 tablet by mouth daily at 12 noon.    Saw Palmetto 450 MG CAPS Take 450 mg by mouth daily at 12 noon.    triamcinolone cream (KENALOG) 0.1 % Apply 1 application topically as needed (Hand for eczema). Hand for eczema   Zinc 50 MG TABS Take 50 mg by mouth daily at 12 noon.   No facility-administered encounter medications on file as of 01/09/2021.  :  Review of Systems:  Out of a complete 14 point review of systems, all are reviewed and negative with the exception of these symptoms as listed below:  Review of Systems  Neurological:        Pt states he has numbness in right arm and leg. A week ago from last Friday. The numbness lasted for 30 minutes and after that it stopped. By Friday night numbness went away .  Has no numbness today .    Objective:  Neurological Exam  Physical Exam Physical Examination:   Vitals:   01/09/21 0753  BP: 126/87  Pulse: 88    General Examination: The patient is a very pleasant 60 y.o. male in no acute distress. He appears well-developed and well-nourished and well groomed.   HEENT: Normocephalic, atraumatic, status post bilateral cataract repairs, corrective eyeglasses in place.  Pupils are mildly irregular and anisocoric, not unusual and it is baseline for him since his detached retina surgery bilaterally.  Left pupil a little bigger than right, both irregular, both reactive to light.  Extraocular tracking is preserved, hearing is grossly intact, face is symmetric with  normal facial animation, and normal facial sensation to light touch, temperature, vibration and pinprick.  Speech is clear with no dysarthria or hypophonia.  No lip, neck or jaw tremor.  Airway examination reveals moderate mouth dryness, tongue protrudes centrally in palate elevates symmetrically, small airway noted.  No carotid  bruits.   Chest: Clear to auscultation without wheezing, rhonchi or crackles noted.   Heart: S1+S2+0, regular.     Abdomen: Soft, non-tender and non-distended.   Extremities: There is no obvious edema in the distal legs.  Pedal pulses good.  Skin: Warm and dry without trophic changes noted.    Musculoskeletal: exam reveals no obvious joint deformities.    Neurologically:  Mental status: The patient is awake, alert and oriented in all 4 spheres. His immediate and remote memory, attention, language skills and fund of knowledge are appropriate. There is no evidence of aphasia, agnosia, apraxia or anomia. Speech is clear with normal prosody and enunciation. Thought process is linear. Mood is normal and affect is normal.  Cranial nerves II - XII are as described above under HEENT exam.  Motor exam: Normal bulk, strength and tone is noted. There is no drift, or tremor, fine motor skills and coordination: grossly intact.  Cerebellar testing: No dysmetria or intention tremor. There is no truncal or gait ataxia.  Finger-to-nose and heel-to-shin unremarkable bilaterally. Sensory exam: intact to light touch, pinprick, temperature and vibration in the upper and lower extremities.  No difference between left and right. Gait, station and balance: He stands easily. No veering to one side is noted. No leaning to one side is noted. Posture is age-appropriate and stance is narrow based. Gait shows normal stride length and normal pace. No problems turning are noted.  Tandem walk is good, Romberg negative. Assessment and Plan:  In summary, IKE MARAGH is a very pleasant 60 year old male with an underlying medical history of seasonal allergies, kidney lesion, eczema, A. fib, and overweight state, who presents for a new problem visit of transient numbness, possibly slight weakness that happened a little over a week ago, lasting about 30 minutes.  Differential diagnosis certainly does include TIA, risk factors  include obesity, A. fib, sleep apnea but other than that he has been pursuing a healthy lifestyle, is physically active, currently feels at baseline and neurological exam is nonfocal.  We talked about TIA prevention, and stroke secondary prevention.  We talked about the importance of pursuing healthy lifestyle and he is advised strongly to call 911 or proceed to the emergency room, should he ever have a sudden episode of weakness, slurring of speech, droopy face, or numbness similar to the one he had.  He is agreeable to this.  He is advised to proceed with a brain MRI with and without contrast.  He is fasting today and we will go ahead and check his lipid profile.  He is typically due for his physical in the early part of next year.  He is not on any cholesterol medication, he is on cardiac medication including Eliquis.  He is advised to follow-up with his cardiologist as scheduled and proceed with the testing that are pending through cardiology.  We will call him with his blood test results and brain MRI results or inform him through [employer].  He is advised to keep his scheduled appointment for sleep apnea checkup in February 2023 and continue to be compliant with his AutoPap machine.  I answered all his questions today and he was in agreement with the  plan.  I spent 40 minutes in total face-to-face time and in reviewing records during pre-charting, more than 50% of which was spent in counseling and coordination of care, reviewing test results, reviewing medications and treatment regimen and/or in discussing or reviewing the diagnosis of TIA, OSA, stroke risk factors, the prognosis and treatment options. Pertinent laboratory and imaging test results that were available during this visit with the patient were reviewed by me and considered in my medical decision making (see chart for details).

## 2021-01-10 LAB — COMPREHENSIVE METABOLIC PANEL
ALT: 27 IU/L (ref 0–44)
AST: 29 IU/L (ref 0–40)
Albumin/Globulin Ratio: 2 (ref 1.2–2.2)
Albumin: 4.3 g/dL (ref 3.8–4.8)
Alkaline Phosphatase: 69 IU/L (ref 44–121)
BUN/Creatinine Ratio: 16 (ref 10–24)
BUN: 14 mg/dL (ref 8–27)
Bilirubin Total: 0.6 mg/dL (ref 0.0–1.2)
CO2: 23 mmol/L (ref 20–29)
Calcium: 9.3 mg/dL (ref 8.6–10.2)
Chloride: 102 mmol/L (ref 96–106)
Creatinine, Ser: 0.87 mg/dL (ref 0.76–1.27)
Globulin, Total: 2.1 g/dL (ref 1.5–4.5)
Glucose: 87 mg/dL (ref 65–99)
Potassium: 4.3 mmol/L (ref 3.5–5.2)
Sodium: 142 mmol/L (ref 134–144)
Total Protein: 6.4 g/dL (ref 6.0–8.5)
eGFR: 98 mL/min/{1.73_m2} (ref >59)

## 2021-01-10 LAB — LIPID PANEL
Chol/HDL Ratio: 4.7 ratio (ref 0.0–5.0)
Cholesterol, Total: 182 mg/dL (ref 100–199)
HDL: 39 mg/dL — ABNORMAL LOW (ref >39)
LDL Chol Calc (NIH): 115 mg/dL — ABNORMAL HIGH (ref 0–99)
Triglycerides: 158 mg/dL — ABNORMAL HIGH (ref 0–149)
VLDL Cholesterol Cal: 28 mg/dL (ref 5–40)

## 2021-01-11 ENCOUNTER — Telehealth: Payer: Self-pay | Admitting: Neurology

## 2021-01-11 NOTE — Telephone Encounter (Signed)
MR Brain w/wo contrast Dr. Rexene Alberts [facility] Josem Kaufmann: [place] via [facility] website. Patient is scheduled at [facility] for 01/23/21.

## 2021-01-15 ENCOUNTER — Other Ambulatory Visit: Payer: Self-pay

## 2021-01-15 ENCOUNTER — Ambulatory Visit: Payer: 59

## 2021-01-15 DIAGNOSIS — R0789 Other chest pain: Secondary | ICD-10-CM

## 2021-01-17 NOTE — Progress Notes (Signed)
Called and spoke to pt, pt voiced understanding. Front is scheduling a follow up appt for him now.

## 2021-01-23 ENCOUNTER — Ambulatory Visit: Payer: 59

## 2021-01-23 ENCOUNTER — Other Ambulatory Visit: Payer: Self-pay

## 2021-01-23 DIAGNOSIS — R29818 Other symptoms and signs involving the nervous system: Secondary | ICD-10-CM | POA: Diagnosis not present

## 2021-01-23 DIAGNOSIS — G4733 Obstructive sleep apnea (adult) (pediatric): Secondary | ICD-10-CM

## 2021-01-23 DIAGNOSIS — G459 Transient cerebral ischemic attack, unspecified: Secondary | ICD-10-CM | POA: Diagnosis not present

## 2021-01-23 DIAGNOSIS — Z8679 Personal history of other diseases of the circulatory system: Secondary | ICD-10-CM

## 2021-01-23 DIAGNOSIS — Z9989 Dependence on other enabling machines and devices: Secondary | ICD-10-CM

## 2021-01-23 MED ORDER — GADOBENATE DIMEGLUMINE 529 MG/ML IV SOLN
20.0000 mL | Freq: Once | INTRAVENOUS | Status: AC | PRN
  Administered 2021-01-23: 20 mL via INTRAVENOUS

## 2021-01-26 ENCOUNTER — Other Ambulatory Visit: Payer: 59

## 2021-01-26 ENCOUNTER — Ambulatory Visit
Admission: RE | Admit: 2021-01-26 | Discharge: 2021-01-26 | Disposition: A | Discharge status: Home / Self Care | Payer: 59 | Source: Ambulatory Visit | Attending: Student | Admitting: Student

## 2021-01-26 DIAGNOSIS — G459 Transient cerebral ischemic attack, unspecified: Secondary | ICD-10-CM

## 2021-01-26 MED ORDER — IOPAMIDOL (ISOVUE-370) INJECTION 76%
75.0000 mL | Freq: Once | INTRAVENOUS | Status: AC | PRN
  Administered 2021-01-26: 75 mL via INTRAVENOUS

## 2021-01-26 NOTE — Progress Notes (Signed)
No acute brain abnormality or significant carotid artery stenosis

## 2021-01-26 NOTE — Progress Notes (Signed)
Called and spoke with patient regarding his CT results.

## 2021-01-29 NOTE — Progress Notes (Signed)
Patient referred by Josetta Huddle, MD for atrial fibrillation  Subjective:   Dylan Wilkerson, male    DOB: 1961-02-15, 60 y.o.   MRN: [id number]   Chief Complaint  Patient presents with   Follow-up   Numbness   Persistent atrial fibrillation     HPI  60 y.o. Caucasian male with hypertension, paroxysmal atrial fibrillation, now with recovered EF, possible episode of TIA 12/2020  In 12/2020, patient had an episode of left sided numbness and weakness, lasting for 30 min.  Patient did not immediately go to the ER as recommended.  Fortunately, he did not have any recurrence of similar symptoms or any other stroke/TIA symptoms.  He was seen by Dr. Rexene Alberts.  TIA was definitely on the differential.  However, MRI brain was normal, CTA head/neck showed no significant stenosis.  Patient has not had any recurrent symptoms.  On a separate note, on exercise nuclear stress test, patient only exercised for 4 months.  However, on further questioning, patient tells me that he did not stop exercise due to significant dyspnea or chest pain.  The technician asked the patient to stop.  I wonder if this was because of A. fib with RVR.  Patient overall remains in good spirits and denies any significant symptoms.    Current Outpatient Medications on File Prior to Visit  Medication Sig Dispense Refill   Bioflavonoid Products (ESTER-C) TABS Take 1 tablet by mouth daily. 1051m     brimonidine-timolol (COMBIGAN) 0.2-0.5 % ophthalmic solution Place 1 drop into both eyes 2 (two) times daily.      Cholecalciferol (VITAMIN D-3) 125 MCG (5000 UT) TABS Take 5,000 Units by mouth daily at 12 noon.     clobetasol ointment (TEMOVATE) 05.20% Apply 1 application topically 2 (two) times daily as needed (skin irritation.).      diltiazem (CARDIZEM CD) 120 MG 24 hr capsule TAKE 1 CAPSULE(120 MG) BY MOUTH DAILY 30 capsule 2   diphenhydrAMINE (BENADRYL) 25 MG tablet Take 25 mg by mouth 3 (three) times daily as needed for  allergies.      ELIQUIS 5 MG TABS tablet TAKE 1 TABLET(5 MG) BY MOUTH TWICE DAILY 60 tablet 3   lisinopril (ZESTRIL) 40 MG tablet TAKE 1 TABLET(40 MG) BY MOUTH DAILY 90 tablet 1   loratadine (CLARITIN) 10 MG tablet Take 10 mg by mouth daily.     Misc Natural Products (GLUCOSAMINE CHOND MSM FORMULA) TABS Take 1 tablet by mouth daily at 12 noon.      Multiple Vitamin (MULTIVITAMIN WITH MINERALS) TABS tablet Take 1 tablet by mouth daily at 12 noon.     Multiple Vitamins-Minerals (PRESERVISION AREDS 2 PO) Take 1 tablet by mouth in the morning and at bedtime.      Omega 3-6-9 Fatty Acids (OMEGA 3-6-9 COMPLEX) CAPS Take 1 tablet by mouth daily at 12 noon.      Saw Palmetto 450 MG CAPS Take 450 mg by mouth daily at 12 noon.      triamcinolone cream (KENALOG) 0.1 % Apply 1 application topically as needed (Hand for eczema). Hand for eczema     Zinc 50 MG TABS Take 50 mg by mouth daily at 12 noon.     No current facility-administered medications on file prior to visit.    Cardiovascular and other pertinent studies:  Exercise Tetrofosmin stress test 01/15/2021: Exercise nuclear stress test was performed using Bruce protocol. Patient reached 4.2 METS, and 92% of age predicted maximum heart rate. Exercise capacity  was low. No chest pain reported. Heart rate and hemodynamic response were normal. Peak EKG demonstrated atrial fibrillation with rapid ventricular rate, no significant ST-T changes. Normal myocardial perfusion. Stress LVEF 60%. Low risk study.    CTA head/neck 01/26/2021: 1. Minimal atherosclerosis without a large or medium vessel occlusion or significant stenosis in the head or neck. 2. Unremarkable CT appearance of the brain. 3. 2 mm left upper lobe lung nodule. No follow-up needed if patient is low-risk. Non-contrast chest CT can be considered in 12 months if patient is high-risk. This recommendation follows the consensus statement: Guidelines for Management of Incidental Pulmonary  Nodules Detected on CT Images: From the Fleischner Society 2017; Radiology 2017; 284:228-243.   MRI brian 01/23/2021: Normal  EKG 12/20/2020:  Atrial fibrillation 88 bpm  Echocardiogram 06/27/2020:  Normal LV systolic function with visual EF 50-55%. Left ventricle cavity  is normal in size. Normal global wall motion. Unable to evaluate diastolic  function due to atrial fibrillation. Calculated EF 51%.  Moderate (Grade II) aortic regurgitation.  Moderate (Grade II) mitral regurgitation.  Mild tricuspid regurgitation. No evidence of pulmonary hypertension.  Mild pulmonic regurgitation.  Compared to prior study dated 10/05/2019: Unable to quantify diastolic  dysfunction due to atrial fibrillation. AR and PR are stable. Mild MR is  now Moderate MR.   Unsuccessful cardioversion attempt 03/14/2020  Direct current cardioversion 07/27/2019: Indication symptomatic A. Fibrillation. Procedure: Using 130 mg of IV Propofol and 50 IV Lidocaine (for reducing venous pain) for achieving deep sedation, synchronized direct current cardioversion performed. Patient was delivered with 1560x1, 200 Joules of electricity X 3 with success to NSR. Patient tolerated the procedure well. No immediate complication noted.  Also received 5 mg lopressor IV prior to procedure.     Recent labs: 03/08/2020: Glucose 73, BUN/Cr 12/0.79. EGFR 98. Na/K 141/4.6.   11/19/2019: Glucose 84, BUN/Cr 17/0.76. EGFR 99. Na/K 140/4.4.    04/19/2019: Glucose 83, BUN/Cr 13/0.78. EGFR normal. Na/K 140/4.2. Rest of the CMP normal H/H 15/43. MCV 91. Platelets 203    Review of Systems  Cardiovascular:  Positive for chest pain. Negative for dyspnea on exertion, leg swelling, palpitations and syncope.      Vitals:   01/30/21 1421  BP: 132/86  Pulse: 88  Resp: 16  Temp: 98 F (36.7 C)  SpO2: 98%     Body mass index is 29.84 kg/m. Filed Weights   01/30/21 1421  Weight: 220 lb (99.8 kg)     Objective:   Physical  Exam Vitals and nursing note reviewed.  Constitutional:      General: He is not in acute distress. Neck:     Vascular: No JVD.  Cardiovascular:     Rate and Rhythm: Normal rate. Rhythm irregular.     Heart sounds: Normal heart sounds. No murmur heard. Pulmonary:     Effort: Pulmonary effort is normal.     Breath sounds: Normal breath sounds. No wheezing or rales.  Musculoskeletal:     Right lower leg: No edema.     Left lower leg: No edema.        Assessment & Recommendations:    60 y.o. Caucasian male with hypertension, paroxysmal atrial fibrillation, now with recovered EF, possible episode of TIA 12/2020  Atypical chest pain: Resolved.  No ischemia on stress testing.  4 METS of activity, likely related to A. fib with RVR.  Possible TIA: Episode of left-sided numbness in 12/2020.  MRI brain, CTA head/neck has been essentially unremarkable.  I will  check with patient's neurologist Dr. Rexene Alberts regarding the following. If this is was truly a TIA episode, should we consider this as failure of Eliquis and switch him to Xarelto, assuming A. fib is most likely etiology.  Alternatively, could paradoxical embolus/PFO be considered as a possible etiology. I will circle back with the patient after discussing with Dr. Rexene Alberts.  Persistent atrial fibrillation: Unsuccessful cardioversion attempt on 03/14/2020. Remains in rate controlled Afib without any symptoms. Tolerating diltiazem 120 mg daily. CHA2DS2-VASc score is 2, given prior history of heart failure, with annual stroke risk for 2%.  Continue Eliquis 5 mg twice daily.  Given his prior heart failure, although now with recovered EF, I am rather reluctant to use antiarrhythmic medications other than Tikosyn and amiodarone.  Risk profile of amiodarone makes it less favorable for use in a 60 year old patient.  Tikosyn will require hospitalization, which patient does not want to consider at this point.  For the same reason, also not considering  ablation.  Overall, he is relatively asymptomatic and rate controlled with his A. fib.  We have mutually decided to continue rate control approach. Patient was to continue conservative rate control approach  Hypertension: Well controlled  F/u in 6 months  Dmari Schubring Esther Hardy, MD [facility] [place] Pager: [phone] Office: [phone]

## 2021-01-30 ENCOUNTER — Telehealth: Payer: Self-pay | Admitting: *Deleted

## 2021-01-30 ENCOUNTER — Other Ambulatory Visit: Payer: Self-pay

## 2021-01-30 ENCOUNTER — Encounter: Payer: Self-pay | Admitting: Cardiology

## 2021-01-30 ENCOUNTER — Ambulatory Visit: Payer: 59 | Admitting: Cardiology

## 2021-01-30 VITALS — BP 132/86 | HR 88 | Temp 98.0°F | Resp 16 | Ht 72.0 in | Wt 220.0 lb

## 2021-01-30 DIAGNOSIS — I4819 Other persistent atrial fibrillation: Secondary | ICD-10-CM

## 2021-01-30 DIAGNOSIS — G459 Transient cerebral ischemic attack, unspecified: Secondary | ICD-10-CM | POA: Insufficient documentation

## 2021-01-30 NOTE — Telephone Encounter (Signed)
Relayed per mychart that MRI brain study was normal study.

## 2021-02-12 ENCOUNTER — Other Ambulatory Visit: Payer: Self-pay | Admitting: Cardiology

## 2021-02-14 ENCOUNTER — Telehealth: Payer: Self-pay

## 2021-03-15 NOTE — Telephone Encounter (Signed)
Discussed with Neurology. Continue Xarelto for now.

## 2021-04-03 ENCOUNTER — Other Ambulatory Visit: Payer: Self-pay | Admitting: Cardiology

## 2021-04-03 DIAGNOSIS — I4819 Other persistent atrial fibrillation: Secondary | ICD-10-CM

## 2021-04-11 ENCOUNTER — Other Ambulatory Visit: Payer: Self-pay | Admitting: Cardiology

## 2021-06-05 ENCOUNTER — Other Ambulatory Visit: Payer: Self-pay | Admitting: Cardiology

## 2021-06-05 DIAGNOSIS — I10 Essential (primary) hypertension: Secondary | ICD-10-CM

## 2021-06-11 ENCOUNTER — Other Ambulatory Visit: Payer: Self-pay

## 2021-06-11 ENCOUNTER — Telehealth: Payer: Self-pay | Admitting: Student

## 2021-06-11 DIAGNOSIS — I4819 Other persistent atrial fibrillation: Secondary | ICD-10-CM

## 2021-06-11 DIAGNOSIS — I10 Essential (primary) hypertension: Secondary | ICD-10-CM

## 2021-06-11 MED ORDER — LISINOPRIL 40 MG PO TABS: ORAL_TABLET | ORAL | 3 refills | Status: DC

## 2021-06-11 MED ORDER — DILTIAZEM HCL ER COATED BEADS 120 MG PO CP24: ORAL_CAPSULE | ORAL | 3 refills | Status: DC

## 2021-06-11 MED ORDER — APIXABAN 5 MG PO TABS: ORAL_TABLET | ORAL | 3 refills | Status: DC

## 2021-06-11 NOTE — Telephone Encounter (Signed)
Pt requesting refills for diltiazem, lisinopril and eliquis.

## 2021-06-21 NOTE — Progress Notes (Signed)
Patient referred by Josetta Huddle, MD for atrial fibrillation  Subjective:   Dylan Wilkerson, male    DOB: [birth year], 61 y.o.   MRN: [id number]   Chief Complaint  Patient presents with   Atrial Fibrillation   Follow-up     HPI  61 y.o. Caucasian male with hypertension, paroxysmal atrial fibrillation, now with recovered EF, possible episode of TIA 12/2020  Patient was last seen in our office 01/2021 at which time he was advised to continue Eliquis, did not consider episode in 12/2018 to be a failure of anticoagulation.  Given patient's prior history of heart failure antiarrhythmic options include Tikosyn and amiodarone, which are not ideal and therefore shared decision with patient and Dr. Virgina Jock last office visit was to continue with rate control strategy. He now presents for follow up.   Patient continues to walk 2-1/2 to 4 miles daily without issue and also does yoga multiple times per week.  He is careful with his diet and also take strict COVID precautions.  Patient has had no recurrence of symptoms concerning for stroke or TIA.  He is in fact fairly asymptomatic without complaints today  Current Outpatient Medications on File Prior to Visit  Medication Sig Dispense Refill   apixaban (ELIQUIS) 5 MG TABS tablet TAKE 1 TABLET(5 MG) BY MOUTH TWICE DAILY 60 tablet 3   Bioflavonoid Products (ESTER-C) TABS Take 1 tablet by mouth daily. 1056m     brimonidine-timolol (COMBIGAN) 0.2-0.5 % ophthalmic solution Place 1 drop into both eyes 2 (two) times daily.      Cholecalciferol (VITAMIN D-3) 125 MCG (5000 UT) TABS Take 5,000 Units by mouth daily at 12 noon.     clobetasol ointment (TEMOVATE) 04.69% Apply 1 application topically 2 (two) times daily as needed (skin irritation.).      diltiazem (CARDIZEM CD) 120 MG 24 hr capsule TAKE 1 CAPSULE(120 MG) BY MOUTH DAILY 30 capsule 3   diphenhydrAMINE (BENADRYL) 25 MG tablet Take 25 mg by mouth 3 (three) times daily as needed for allergies.       lisinopril (ZESTRIL) 40 MG tablet TAKE 1 TABLET(40 MG) BY MOUTH DAILY 90 tablet 3   loratadine (CLARITIN) 10 MG tablet Take 10 mg by mouth daily.     Misc Natural Products (GLUCOSAMINE CHOND MSM FORMULA) TABS Take 1 tablet by mouth daily at 12 noon.      Multiple Vitamin (MULTIVITAMIN WITH MINERALS) TABS tablet Take 1 tablet by mouth daily at 12 noon.     Multiple Vitamins-Minerals (PRESERVISION AREDS 2 PO) Take 1 tablet by mouth in the morning and at bedtime.      Omega 3-6-9 Fatty Acids (OMEGA 3-6-9 COMPLEX) CAPS Take 1 tablet by mouth daily at 12 noon.      Saw Palmetto 450 MG CAPS Take 450 mg by mouth daily at 12 noon.      triamcinolone cream (KENALOG) 0.1 % Apply 1 application topically as needed (Hand for eczema). Hand for eczema     Zinc 50 MG TABS Take 50 mg by mouth daily at 12 noon.     No current facility-administered medications on file prior to visit.    Cardiovascular and other pertinent studies: EKG 06/22/2021: Atrial fibrillation at a rate of 81 bpm.  Exercise Tetrofosmin stress test 01/15/2021: Exercise nuclear stress test was performed using Bruce protocol. Patient reached 4.2 METS, and 92% of age predicted maximum heart rate. Exercise capacity was low. No chest pain reported. Heart rate and hemodynamic response were  normal. Peak EKG demonstrated atrial fibrillation with rapid ventricular rate, no significant ST-T changes. Normal myocardial perfusion. Stress LVEF 60%. Low risk study.   CTA head/neck 01/26/2021: 1. Minimal atherosclerosis without a large or medium vessel occlusion or significant stenosis in the head or neck. 2. Unremarkable CT appearance of the brain. 3. 2 mm left upper lobe lung nodule. No follow-up needed if patient is low-risk. Non-contrast chest CT can be considered in 12 months if patient is high-risk. This recommendation follows the consensus statement: Guidelines for Management of Incidental Pulmonary Nodules Detected on CT Images: From the  Fleischner Society 2017; Radiology 2017; 284:228-243.   MRI brian 01/23/2021: Normal  EKG 12/20/2020:  Atrial fibrillation 88 bpm  Echocardiogram 06/27/2020:  Normal LV systolic function with visual EF 50-55%. Left ventricle cavity  is normal in size. Normal global wall motion. Unable to evaluate diastolic  function due to atrial fibrillation. Calculated EF 51%.  Moderate (Grade II) aortic regurgitation.  Moderate (Grade II) mitral regurgitation.  Mild tricuspid regurgitation. No evidence of pulmonary hypertension.  Mild pulmonic regurgitation.  Compared to prior study dated 10/05/2019: Unable to quantify diastolic  dysfunction due to atrial fibrillation. AR and PR are stable. Mild MR is  now Moderate MR.   Unsuccessful cardioversion attempt 03/14/2020  Direct current cardioversion 07/27/2019: Indication symptomatic A. Fibrillation. Procedure: Using 130 mg of IV Propofol and 50 IV Lidocaine (for reducing venous pain) for achieving deep sedation, synchronized direct current cardioversion performed. Patient was delivered with 1560x1, 200 Joules of electricity X 3 with success to NSR. Patient tolerated the procedure well. No immediate complication noted.  Also received 5 mg lopressor IV prior to procedure.     Recent labs: 01/09/2021: BUN 14, creatinine 0.87, GFR >60, sodium 142, potassium 4.3 Total cholesterol 182, triglycerides 158, HDL 39, LDL 115  03/08/2020: Glucose 73, BUN/Cr 12/0.79. EGFR 98. Na/K 141/4.6.   11/19/2019: Glucose 84, BUN/Cr 17/0.76. EGFR 99. Na/K 140/4.4.    04/19/2019: Glucose 83, BUN/Cr 13/0.78. EGFR normal. Na/K 140/4.2. Rest of the CMP normal H/H 15/43. MCV 91. Platelets 203    Review of Systems  Cardiovascular:  Negative for chest pain, dyspnea on exertion, leg swelling, palpitations and syncope.      Vitals:   06/22/21 1033  BP: 123/90  Pulse: 87  Temp: 98 F (36.7 C)  SpO2: 100%     Body mass index is 30.52 kg/m. Filed Weights   06/22/21  1033  Weight: 225 lb (102.1 kg)     Objective:   Physical Exam Vitals reviewed.  Constitutional:      General: He is not in acute distress. Neck:     Vascular: No JVD.  Cardiovascular:     Rate and Rhythm: Normal rate. Rhythm irregular.     Heart sounds: Normal heart sounds. No murmur heard. Pulmonary:     Effort: Pulmonary effort is normal.     Breath sounds: Normal breath sounds.  Musculoskeletal:     Right lower leg: No edema.     Left lower leg: No edema.        Assessment & Recommendations:    61 y.o. Caucasian male with hypertension, paroxysmal atrial fibrillation, now with recovered EF, possible episode of TIA 12/2020  Persistent atrial fibrillation: Unsuccessful cardioversion attempt on 03/14/2020. Patient remains asymptomatic and is well rate controlled per EKG Given prior history of heart failure and that he is only 60 options regarding antiarrhythmic therapy are limited to Tikosyn and amiodarone.  Patient does not wish to have  hospitalization for Tikosyn initiation and he is young, therefore would recommend avoiding amiodarone.  Shared decision has been to continue rate control strategy He continues to tolerate anticoagulation without bleeding diathesis. CHA2DS2-VASc score is 2, given prior history of heart failure, with annual stroke risk for 2%.  Continue Eliquis 5 mg twice daily.  Continue diltiazem  Hypertension: Well-controlled Continue diltiazem, lisinopril  Hyperlipidemia:  I personally reviewed external labs, lipids are uncontrolled.  However patient does have repeat lipid profile testing later this month with PCP. Will defer further management of hyperlipidemia to PCP Encourage patient to continue with strict diet compliance  Atypical chest pain: Patient has had no recurrence of chest pain. There is no evidence of ischemia on stress testing Patient will notify our office if he has recurrence of symptoms.  Possible TIA: Episode of left-sided  numbness in 12/2020.   MRI brain, CTA head/neck has been essentially unremarkable.  Our office consulted with neurology who does not feel this episode was available of Eliquis, therefore have continued anticoagulation with Eliquis. He has had no recurrence of symptoms and therefore we will continue with watchful waiting, no further evaluation indicated at this time.  Follow up in 6 months, sooner if needed.    Alethia Berthold, PA-C 06/22/2021, 11:01 AM Office: [phone]

## 2021-06-22 ENCOUNTER — Encounter: Payer: Self-pay | Admitting: Student

## 2021-06-22 ENCOUNTER — Ambulatory Visit: Payer: 59 | Admitting: Student

## 2021-06-22 ENCOUNTER — Ambulatory Visit: Payer: 59 | Admitting: Cardiology

## 2021-06-22 ENCOUNTER — Other Ambulatory Visit: Payer: Self-pay

## 2021-06-22 VITALS — BP 123/90 | HR 87 | Temp 98.0°F | Ht 72.0 in | Wt 225.0 lb

## 2021-06-22 DIAGNOSIS — I10 Essential (primary) hypertension: Secondary | ICD-10-CM

## 2021-06-22 DIAGNOSIS — I4819 Other persistent atrial fibrillation: Secondary | ICD-10-CM

## 2021-06-22 DIAGNOSIS — R0789 Other chest pain: Secondary | ICD-10-CM

## 2021-07-04 ENCOUNTER — Ambulatory Visit: Payer: 59 | Admitting: Adult Health

## 2021-10-18 ENCOUNTER — Other Ambulatory Visit: Payer: Self-pay | Admitting: Cardiology

## 2021-10-18 DIAGNOSIS — I4819 Other persistent atrial fibrillation: Secondary | ICD-10-CM

## 2021-10-30 ENCOUNTER — Encounter: Payer: Self-pay | Admitting: Adult Health

## 2021-10-30 ENCOUNTER — Ambulatory Visit: Payer: 59 | Admitting: Adult Health

## 2021-10-30 VITALS — BP 114/79 | HR 75 | Ht 72.0 in | Wt 226.6 lb

## 2021-10-30 DIAGNOSIS — Z9989 Dependence on other enabling machines and devices: Secondary | ICD-10-CM | POA: Diagnosis not present

## 2021-10-30 DIAGNOSIS — G4733 Obstructive sleep apnea (adult) (pediatric): Secondary | ICD-10-CM

## 2021-10-30 NOTE — Patient Instructions (Signed)
Continue using CPAP nightly and greater than 4 hours each night °If your symptoms worsen or you develop new symptoms please let us know.  ° °

## 2021-10-30 NOTE — Progress Notes (Signed)
PATIENT: Dylan Wilkerson DOB: [birth year]  REASON FOR VISIT: follow up HISTORY FROM: patient PRIMARY NEUROLOGIST: Dr. Rexene Alberts  Chief Complaint  Patient presents with   Follow-up    Pt in 5  pt is here for CPAP follow up Pt states that he has no questions or concerns for this visit      HISTORY OF PRESENT ILLNESS: Today 10/30/21:  Dylan Wilkerson is a 61 year old male with a history of OSA on CPAP. She returns today for follow-up. No new issues with CPAP. Reports that he has not had any additional stroke like symptoms.     REVIEW OF SYSTEMS: Out of a complete 14 system review of symptoms, the patient complains only of the following symptoms, and all other reviewed systems are negative.   ESS 0  ALLERGIES: Allergies  Allergen Reactions   Dairy Aid [Tilactase] Other (See Comments)    Gi upset    HOME MEDICATIONS: Outpatient Medications Prior to Visit  Medication Sig Dispense Refill   Bioflavonoid Products (ESTER-C) TABS Take 1 tablet by mouth daily. '1000mg'$      brimonidine-timolol (COMBIGAN) 0.2-0.5 % ophthalmic solution Place 1 drop into both eyes 2 (two) times daily.      Cholecalciferol (VITAMIN D-3) 125 MCG (5000 UT) TABS Take 5,000 Units by mouth daily at 12 noon.     clobetasol ointment (TEMOVATE) 6.76 % Apply 1 application topically 2 (two) times daily as needed (skin irritation.).      diltiazem (CARDIZEM CD) 120 MG 24 hr capsule TAKE 1 CAPSULE(120 MG) BY MOUTH DAILY 30 capsule 3   diphenhydrAMINE (BENADRYL) 25 MG tablet Take 25 mg by mouth 3 (three) times daily as needed for allergies.      ELIQUIS 5 MG TABS tablet TAKE 1 TABLET(5 MG) BY MOUTH TWICE DAILY 60 tablet 3   lisinopril (ZESTRIL) 40 MG tablet TAKE 1 TABLET(40 MG) BY MOUTH DAILY 90 tablet 3   loratadine (CLARITIN) 10 MG tablet Take 10 mg by mouth daily.     Misc Natural Products (GLUCOSAMINE CHOND MSM FORMULA) TABS Take 1 tablet by mouth daily at 12 noon.      Multiple Vitamin (MULTIVITAMIN WITH MINERALS)  TABS tablet Take 1 tablet by mouth daily at 12 noon.     Multiple Vitamins-Minerals (PRESERVISION AREDS 2 PO) Take 1 tablet by mouth in the morning and at bedtime.      Omega 3-6-9 Fatty Acids (OMEGA 3-6-9 COMPLEX) CAPS Take 1 tablet by mouth daily at 12 noon.      rosuvastatin (CRESTOR) 10 MG tablet rosuvastatin 10 mg tablet     Saw Palmetto 450 MG CAPS Take 450 mg by mouth daily at 12 noon.      triamcinolone cream (KENALOG) 0.1 % Apply 1 application topically as needed (Hand for eczema). Hand for eczema     Zinc 50 MG TABS Take 50 mg by mouth daily at 12 noon.     No facility-administered medications prior to visit.    PAST MEDICAL HISTORY: Past Medical History:  Diagnosis Date   A-fib ([place])    Cardiomyopathy ([place])    Eczema    Kidney lesion    "has been cleared"   Seasonal allergies     PAST SURGICAL HISTORY: Past Surgical History:  Procedure Laterality Date   CARDIOVERSION N/A 07/27/2019   Procedure: CARDIOVERSION;  Surgeon: Adrian Prows, MD;  Location: [facility];  Service: Cardiovascular;  Laterality: N/A;   CARDIOVERSION N/A 03/14/2020   Procedure: CARDIOVERSION;  Surgeon:  Patwardhan, Reynold Bowen, MD;  Location: [facility];  Service: Cardiovascular;  Laterality: N/A;   CATARACT EXTRACTION Bilateral    age 67 right eye, age 75 lft eye    FAMILY HISTORY: Family History  Problem Relation Age of Onset   Dementia Father    Prostate cancer Father    Sleep apnea Neg Hx     SOCIAL HISTORY: Social History   Socioeconomic History   Marital status: Single    Spouse name: Not on file   Number of children: 0   Years of education: Not on file   Highest education level: Not on file  Occupational History   Not on file  Tobacco Use   Smoking status: Never   Smokeless tobacco: Never  Vaping Use   Vaping Use: Never used  Substance and Sexual Activity   Alcohol use: No   Drug use: Never   Sexual activity: Not on file  Other Topics Concern   Not on file  Social History  Narrative   Not on file   Social Determinants of Health   Financial Resource Strain: Not on file  Food Insecurity: Not on file  Transportation Needs: Not on file  Physical Activity: Not on file  Stress: Not on file  Social Connections: Not on file  Intimate Partner Violence: Not on file      PHYSICAL EXAM  Vitals:   10/30/21 0852  BP: 114/79  Pulse: 75  Weight: 226 lb 9.6 oz (102.8 kg)  Height: 6' (1.829 m)   Body mass index is 30.73 kg/m.  Generalized: Well developed, in no acute distress  Chest: Lungs clear to auscultation bilaterally  Neurological examination  Mentation: Alert oriented to time, place, history taking. Follows all commands speech and language fluent Cranial nerve II-XII: Extraocular movements were full, visual field were full on confrontational test Head turning and shoulder shrug  were normal and symmetric. Gait and station: Gait is normal.    DIAGNOSTIC DATA (LABS, IMAGING, TESTING) - I reviewed patient records, labs, notes, testing and imaging myself where available.      Component Value Date/Time   NA 142 01/09/2021 0906   K 4.3 01/09/2021 0906   CL 102 01/09/2021 0906   CO2 23 01/09/2021 0906   GLUCOSE 87 01/09/2021 0906   BUN 14 01/09/2021 0906   CREATININE 0.87 01/09/2021 0906   CALCIUM 9.3 01/09/2021 0906   PROT 6.4 01/09/2021 0906   ALBUMIN 4.3 01/09/2021 0906   AST 29 01/09/2021 0906   ALT 27 01/09/2021 0906   ALKPHOS 69 01/09/2021 0906   BILITOT 0.6 01/09/2021 0906   GFRNONAA 98 03/08/2020 1055   GFRAA 113 03/08/2020 1055   Lab Results  Component Value Date   CHOL 182 01/09/2021   HDL 39 (L) 01/09/2021   LDLCALC 115 (H) 01/09/2021   TRIG 158 (H) 01/09/2021   CHOLHDL 4.7 01/09/2021     ASSESSMENT AND PLAN 61 y.o. year old male  has a past medical history of A-fib ([place]), Cardiomyopathy ([place]), Eczema, Kidney lesion, and Seasonal allergies. here with:  OSA on CPAP  - CPAP compliance excellent - Good treatment of AHI   - Encourage patient to use CPAP nightly and > 4 hours each night - F/U in 1 year or sooner if needed   Ward Givens, MSN, NP-C 10/30/2021, 9:02 AM [facility] [address] [place], [place] [address] [phone]

## 2021-11-26 ENCOUNTER — Other Ambulatory Visit: Payer: Self-pay | Admitting: Cardiology

## 2021-12-20 ENCOUNTER — Ambulatory Visit: Payer: 59 | Admitting: Student

## 2021-12-21 ENCOUNTER — Encounter: Payer: Self-pay | Admitting: Cardiology

## 2021-12-21 ENCOUNTER — Ambulatory Visit: Payer: 59 | Admitting: Cardiology

## 2021-12-21 VITALS — BP 118/86 | HR 75 | Temp 98.0°F | Resp 16 | Ht 72.0 in | Wt 229.0 lb

## 2021-12-21 DIAGNOSIS — I10 Essential (primary) hypertension: Secondary | ICD-10-CM

## 2021-12-21 DIAGNOSIS — E782 Mixed hyperlipidemia: Secondary | ICD-10-CM

## 2021-12-21 DIAGNOSIS — I4819 Other persistent atrial fibrillation: Secondary | ICD-10-CM

## 2021-12-21 NOTE — Progress Notes (Signed)
Patient referred by Josetta Huddle, MD for atrial fibrillation  Subjective:   Dylan Wilkerson, male    DOB: Apr 09, 1961, 61 y.o.   MRN: [id number]   Chief Complaint  Patient presents with   Atrial Fibrillation   Hypertension   Follow-up    6 month     HPI  61 y.o. Caucasian male with hypertension, paroxysmal atrial fibrillation, now with recovered EF, possible episode of TIA 12/2020  Patient is doing well.  Recently, his walking is less is due to plantar fasciitis.  However, denies any chest pain, shortness of breath, palpitation symptoms.   Current Outpatient Medications:    Bioflavonoid Products (ESTER-C) TABS, Take 1 tablet by mouth daily. 1052m, Disp: , Rfl:    brimonidine-timolol (COMBIGAN) 0.2-0.5 % ophthalmic solution, Place 1 drop into both eyes 2 (two) times daily. , Disp: , Rfl:    Cholecalciferol (VITAMIN D-3) 125 MCG (5000 UT) TABS, Take 5,000 Units by mouth daily at 12 noon., Disp: , Rfl:    clobetasol ointment (TEMOVATE) 00.09%, Apply 1 application topically 2 (two) times daily as needed (skin irritation.). , Disp: , Rfl:    diltiazem (CARDIZEM CD) 120 MG 24 hr capsule, TAKE 1 CAPSULE(120 MG) BY MOUTH DAILY, Disp: 90 capsule, Rfl: 0   diphenhydrAMINE (BENADRYL) 25 MG tablet, Take 25 mg by mouth 3 (three) times daily as needed for allergies. , Disp: , Rfl:    ELIQUIS 5 MG TABS tablet, TAKE 1 TABLET(5 MG) BY MOUTH TWICE DAILY, Disp: 60 tablet, Rfl: 3   lisinopril (ZESTRIL) 40 MG tablet, TAKE 1 TABLET(40 MG) BY MOUTH DAILY, Disp: 90 tablet, Rfl: 3   loratadine (CLARITIN) 10 MG tablet, Take 10 mg by mouth daily., Disp: , Rfl:    Misc Natural Products (GLUCOSAMINE CHOND MSM FORMULA) TABS, Take 1 tablet by mouth daily at 12 noon. , Disp: , Rfl:    Multiple Vitamin (MULTIVITAMIN WITH MINERALS) TABS tablet, Take 1 tablet by mouth daily at 12 noon., Disp: , Rfl:    Multiple Vitamins-Minerals (PRESERVISION AREDS 2 PO), Take 1 tablet by mouth in the morning and at bedtime.  , Disp: , Rfl:    Omega 3-6-9 Fatty Acids (OMEGA 3-6-9 COMPLEX) CAPS, Take 1 tablet by mouth daily at 12 noon. , Disp: , Rfl:    rosuvastatin (CRESTOR) 10 MG tablet, rosuvastatin 10 mg tablet, Disp: , Rfl:    Saw Palmetto 450 MG CAPS, Take 450 mg by mouth daily at 12 noon. , Disp: , Rfl:    triamcinolone cream (KENALOG) 0.1 %, Apply 1 application topically as needed (Hand for eczema). Hand for eczema, Disp: , Rfl:    Zinc 50 MG TABS, Take 50 mg by mouth daily at 12 noon., Disp: , Rfl:   Cardiovascular and other pertinent studies:  EKG 12/21/2021: Atrial fibrillation 86 bpm Nonspecific QRS widening  Exercise Tetrofosmin stress test 01/15/2021: Exercise nuclear stress test was performed using Bruce protocol. Patient reached 4.2 METS, and 92% of age predicted maximum heart rate. Exercise capacity was low. No chest pain reported. Heart rate and hemodynamic response were normal. Peak EKG demonstrated atrial fibrillation with rapid ventricular rate, no significant ST-T changes. Normal myocardial perfusion. Stress LVEF 60%. Low risk study.   CTA head/neck 01/26/2021: 1. Minimal atherosclerosis without a large or medium vessel occlusion or significant stenosis in the head or neck. 2. Unremarkable CT appearance of the brain. 3. 2 mm left upper lobe lung nodule. No follow-up needed if patient is low-risk. Non-contrast chest  CT can be considered in 12 months if patient is high-risk. This recommendation follows the consensus statement: Guidelines for Management of Incidental Pulmonary Nodules Detected on CT Images: From the Fleischner Society 2017; Radiology 2017; 284:228-243.   MRI brian 01/23/2021: Normal  Echocardiogram 06/27/2020:  Normal LV systolic function with visual EF 50-55%. Left ventricle cavity  is normal in size. Normal global wall motion. Unable to evaluate diastolic  function due to atrial fibrillation. Calculated EF 51%.  Moderate (Grade II) aortic regurgitation.  Moderate (Grade II)  mitral regurgitation.  Mild tricuspid regurgitation. No evidence of pulmonary hypertension.  Mild pulmonic regurgitation.  Compared to prior study dated 10/05/2019: Unable to quantify diastolic  dysfunction due to atrial fibrillation. AR and PR are stable. Mild MR is  now Moderate MR.   Unsuccessful cardioversion attempt 03/14/2020  Direct current cardioversion 07/27/2019: Indication symptomatic A. Fibrillation. Procedure: Using 130 mg of IV Propofol and 50 IV Lidocaine (for reducing venous pain) for achieving deep sedation, synchronized direct current cardioversion performed. Patient was delivered with 1560x1, 200 Joules of electricity X 3 with success to NSR. Patient tolerated the procedure well. No immediate complication noted.  Also received 5 mg lopressor IV prior to procedure.     Recent labs: 01/09/2021: BUN 14, creatinine 0.87, GFR >60, sodium 142, potassium 4.3 Total cholesterol 182, triglycerides 158, HDL 39, LDL 115  03/08/2020: Glucose 73, BUN/Cr 12/0.79. EGFR 98. Na/K 141/4.6.   11/19/2019: Glucose 84, BUN/Cr 17/0.76. EGFR 99. Na/K 140/4.4.    04/19/2019: Glucose 83, BUN/Cr 13/0.78. EGFR normal. Na/K 140/4.2. Rest of the CMP normal H/H 15/43. MCV 91. Platelets 203    Review of Systems  Cardiovascular:  Negative for chest pain, dyspnea on exertion, leg swelling, palpitations and syncope.       Vitals:   12/21/21 1140 12/21/21 1149  BP: (!) 120/90 118/86  Pulse: 91 75  Resp: 16   Temp: 98 F (36.7 C)   SpO2: 96%      Body mass index is 31.06 kg/m. Filed Weights   12/21/21 1140  Weight: 229 lb (103.9 kg)     Objective:   Physical Exam Vitals reviewed.  Constitutional:      General: He is not in acute distress. Neck:     Vascular: No JVD.  Cardiovascular:     Rate and Rhythm: Normal rate. Rhythm irregular.     Heart sounds: Normal heart sounds. No murmur heard. Pulmonary:     Effort: Pulmonary effort is normal.     Breath sounds: Normal breath  sounds.  Musculoskeletal:     Right lower leg: No edema.     Left lower leg: No edema.         Assessment & Recommendations:    61 y.o. Caucasian male with hypertension, paroxysmal atrial fibrillation, now with recovered EF, possible episode of TIA 12/2020  Persistent atrial fibrillation: Unsuccessful cardioversion attempt on 03/14/2020. Patient remains asymptomatic and is well rate controlled per EKG Given prior history of heart failure and that he is only 60 options regarding antiarrhythmic therapy are limited to Tikosyn and amiodarone.  Patient does not wish to have hospitalization for Tikosyn initiation and he is young, therefore would recommend avoiding amiodarone.  Shared decision has been to continue rate control strategy He continues to tolerate anticoagulation without bleeding diathesis. CHA2DS2-VASc score is 2, given prior history of heart failure, with annual stroke risk for 2%.  Continue Eliquis 5 mg twice daily.  Continue diltiazem  Hypertension: Well-controlled Continue diltiazem, lisinopril  Hyperlipidemia:  Continue rosuvastatin.  Check lipid panel.    F/u in 6 months   [employer], PA-C 12/21/2021, 7:28 AM Office: [phone]

## 2022-01-08 LAB — BASIC METABOLIC PANEL
BUN/Creatinine Ratio: 13 (ref 10–24)
BUN: 11 mg/dL (ref 8–27)
CO2: 22 mmol/L (ref 20–29)
Calcium: 9.4 mg/dL (ref 8.6–10.2)
Chloride: 104 mmol/L (ref 96–106)
Creatinine, Ser: 0.83 mg/dL (ref 0.76–1.27)
Glucose: 95 mg/dL (ref 70–99)
Potassium: 4.1 mmol/L (ref 3.5–5.2)
Sodium: 143 mmol/L (ref 134–144)
eGFR: 99 mL/min/{1.73_m2} (ref >59)

## 2022-01-08 LAB — LIPID PANEL
Chol/HDL Ratio: 3.4 ratio (ref 0.0–5.0)
Cholesterol, Total: 130 mg/dL (ref 100–199)
HDL: 38 mg/dL — ABNORMAL LOW (ref >39)
LDL Chol Calc (NIH): 69 mg/dL (ref 0–99)
Triglycerides: 128 mg/dL (ref 0–149)
VLDL Cholesterol Cal: 23 mg/dL (ref 5–40)

## 2022-02-09 ENCOUNTER — Other Ambulatory Visit: Payer: Self-pay | Admitting: Cardiology

## 2022-02-09 DIAGNOSIS — I4819 Other persistent atrial fibrillation: Secondary | ICD-10-CM

## 2022-02-19 ENCOUNTER — Other Ambulatory Visit: Payer: Self-pay | Admitting: Cardiology

## 2022-05-14 ENCOUNTER — Other Ambulatory Visit: Payer: Self-pay | Admitting: Cardiology

## 2022-06-07 ENCOUNTER — Other Ambulatory Visit: Payer: Self-pay | Admitting: Cardiology

## 2022-06-07 DIAGNOSIS — I4819 Other persistent atrial fibrillation: Secondary | ICD-10-CM

## 2022-06-11 ENCOUNTER — Other Ambulatory Visit: Payer: Self-pay | Admitting: Cardiology

## 2022-06-11 DIAGNOSIS — I10 Essential (primary) hypertension: Secondary | ICD-10-CM

## 2022-06-24 ENCOUNTER — Ambulatory Visit: Payer: 59 | Admitting: Cardiology

## 2022-06-26 ENCOUNTER — Encounter: Payer: Self-pay | Admitting: Cardiology

## 2022-06-26 ENCOUNTER — Ambulatory Visit: Payer: 59 | Admitting: Cardiology

## 2022-06-26 VITALS — BP 143/97 | HR 92 | Resp 14 | Ht 72.0 in | Wt 229.4 lb

## 2022-06-26 DIAGNOSIS — I10 Essential (primary) hypertension: Secondary | ICD-10-CM

## 2022-06-26 DIAGNOSIS — E782 Mixed hyperlipidemia: Secondary | ICD-10-CM

## 2022-06-26 DIAGNOSIS — I4819 Other persistent atrial fibrillation: Secondary | ICD-10-CM

## 2022-06-26 NOTE — Progress Notes (Signed)
Patient referred by Josetta Huddle, MD for atrial fibrillation  Subjective:   Dylan Wilkerson, male    DOB: [birth year], 62 y.o.   MRN: [id number]   Chief Complaint  Patient presents with   Atrial Fibrillation   Follow-up    6 month     HPI  63 y.o. Caucasian male with hypertension, paroxysmal atrial fibrillation, now with recovered EF, possible episode of TIA 12/2020  Patient is doing well.  He is walking, doing yoga regularly without any symptoms of chest pain, dyspnea, palpitations, leg edema. Blood pressure elevated today, but generally well controlled at home.    Current Outpatient Medications:    Bioflavonoid Products (ESTER-C) TABS, Take 1 tablet by mouth daily. '1000mg'$ , Disp: , Rfl:    brimonidine-timolol (COMBIGAN) 0.2-0.5 % ophthalmic solution, Place 1 drop into both eyes 2 (two) times daily. , Disp: , Rfl:    Cholecalciferol (VITAMIN D-3) 125 MCG (5000 UT) TABS, Take 5,000 Units by mouth daily at 12 noon., Disp: , Rfl:    clobetasol ointment (TEMOVATE) 8.24 %, Apply 1 application topically 2 (two) times daily as needed (skin irritation.). , Disp: , Rfl:    diltiazem (CARDIZEM CD) 120 MG 24 hr capsule, TAKE 1 CAPSULE(120 MG) BY MOUTH DAILY, Disp: 90 capsule, Rfl: 0   diphenhydrAMINE (BENADRYL) 25 MG tablet, Take 25 mg by mouth 3 (three) times daily as needed for allergies. , Disp: , Rfl:    ELIQUIS 5 MG TABS tablet, TAKE 1 TABLET(5 MG) BY MOUTH TWICE DAILY, Disp: 60 tablet, Rfl: 3   lisinopril (ZESTRIL) 40 MG tablet, TAKE 1 TABLET(40 MG) BY MOUTH DAILY, Disp: 90 tablet, Rfl: 3   loratadine (CLARITIN) 10 MG tablet, Take 10 mg by mouth daily., Disp: , Rfl:    Misc Natural Products (GLUCOSAMINE CHOND MSM FORMULA) TABS, Take 1 tablet by mouth daily at 12 noon. , Disp: , Rfl:    Multiple Vitamin (MULTIVITAMIN WITH MINERALS) TABS tablet, Take 1 tablet by mouth daily at 12 noon., Disp: , Rfl:    Multiple Vitamins-Minerals (PRESERVISION AREDS 2 PO), Take 1 tablet by mouth in  the morning and at bedtime. , Disp: , Rfl:    Omega 3-6-9 Fatty Acids (OMEGA 3-6-9 COMPLEX) CAPS, Take 1 tablet by mouth daily at 12 noon. , Disp: , Rfl:    rosuvastatin (CRESTOR) 10 MG tablet, rosuvastatin 10 mg tablet, Disp: , Rfl:    Saw Palmetto 450 MG CAPS, Take 450 mg by mouth daily at 12 noon. , Disp: , Rfl:    triamcinolone cream (KENALOG) 0.1 %, Apply 1 application topically as needed (Hand for eczema). Hand for eczema, Disp: , Rfl:    Zinc 50 MG TABS, Take 50 mg by mouth daily at 12 noon., Disp: , Rfl:   Cardiovascular and other pertinent studies:  EKG 06/26/2022: Atrial fibrillation 112 bpm   Exercise Tetrofosmin stress test 01/15/2021: Exercise nuclear stress test was performed using Bruce protocol. Patient reached 4.2 METS, and 92% of age predicted maximum heart rate. Exercise capacity was low. No chest pain reported. Heart rate and hemodynamic response were normal. Peak EKG demonstrated atrial fibrillation with rapid ventricular rate, no significant ST-T changes. Normal myocardial perfusion. Stress LVEF 60%. Low risk study.   CTA head/neck 01/26/2021: 1. Minimal atherosclerosis without a large or medium vessel occlusion or significant stenosis in the head or neck. 2. Unremarkable CT appearance of the brain. 3. 2 mm left upper lobe lung nodule. No follow-up needed if patient is low-risk. Non-contrast  chest CT can be considered in 12 months if patient is high-risk. This recommendation follows the consensus statement: Guidelines for Management of Incidental Pulmonary Nodules Detected on CT Images: From the Fleischner Society 2017; Radiology 2017; 284:228-243.   MRI brian 01/23/2021: Normal  Echocardiogram 06/27/2020:  Normal LV systolic function with visual EF 50-55%. Left ventricle cavity  is normal in size. Normal global wall motion. Unable to evaluate diastolic  function due to atrial fibrillation. Calculated EF 51%.  Moderate (Grade II) aortic regurgitation.  Moderate  (Grade II) mitral regurgitation.  Mild tricuspid regurgitation. No evidence of pulmonary hypertension.  Mild pulmonic regurgitation.  Compared to prior study dated 10/05/2019: Unable to quantify diastolic  dysfunction due to atrial fibrillation. AR and PR are stable. Mild MR is  now Moderate MR.   Unsuccessful cardioversion attempt 03/14/2020  Direct current cardioversion 07/27/2019: Indication symptomatic A. Fibrillation. Procedure: Using 130 mg of IV Propofol and 50 IV Lidocaine (for reducing venous pain) for achieving deep sedation, synchronized direct current cardioversion performed. Patient was delivered with 1560x1, 200 Joules of electricity X 3 with success to NSR. Patient tolerated the procedure well. No immediate complication noted.  Also received 5 mg lopressor IV prior to procedure.     Recent labs: 01/07/2022: Glucose 95, BUN/Cr 11/0.83. EGFR 99. Na/K 143/4.1.  Chol 130, TG 128, HDL 38, LDL 69  01/09/2021: BUN 14, creatinine 0.87, GFR >60, sodium 142, potassium 4.3 Total cholesterol 182, triglycerides 158, HDL 39, LDL 115  03/08/2020: Glucose 73, BUN/Cr 12/0.79. EGFR 98. Na/K 141/4.6.   11/19/2019: Glucose 84, BUN/Cr 17/0.76. EGFR 99. Na/K 140/4.4.    04/19/2019: Glucose 83, BUN/Cr 13/0.78. EGFR normal. Na/K 140/4.2. Rest of the CMP normal H/H 15/43. MCV 91. Platelets 203    Review of Systems  Cardiovascular:  Negative for chest pain, dyspnea on exertion, leg swelling, palpitations and syncope.       Vitals:   06/26/22 1114 06/26/22 1120  BP: (!) 153/102 (!) 151/101  Pulse: 94 85  Resp: 14   SpO2: 98% 98%     Body mass index is 31.11 kg/m. Filed Weights   06/26/22 1114  Weight: 229 lb 6.4 oz (104.1 kg)     Objective:   Physical Exam Vitals reviewed.  Constitutional:      General: He is not in acute distress. Neck:     Vascular: No JVD.  Cardiovascular:     Rate and Rhythm: Normal rate. Rhythm irregular.     Heart sounds: Normal heart sounds.  No murmur heard. Pulmonary:     Effort: Pulmonary effort is normal.     Breath sounds: Normal breath sounds.  Musculoskeletal:     Right lower leg: No edema.     Left lower leg: No edema.         Assessment & Recommendations:    62 y.o. Caucasian male with hypertension, paroxysmal atrial fibrillation, now with recovered EF, possible episode of TIA 12/2020  Persistent atrial fibrillation: Unsuccessful cardioversion attempt on 03/14/2020. Patient remains asymptomatic and is well rate controlled per EKG Given prior history of heart failure and that he is only 60 options regarding antiarrhythmic therapy are limited to Tikosyn and amiodarone.  Patient does not wish to have hospitalization for Tikosyn initiation and he is young, therefore would recommend avoiding amiodarone.  Shared decision has been to continue rate control strategy He continues to tolerate anticoagulation without bleeding diathesis. CHA2DS2-VASc score is 2, given prior history of heart failure, with annual stroke risk for 2%.  Continue  Eliquis 5 mg twice daily.  Continue diltiazem 120 mg dailly.  Hypertension: BP elevated today. Encouraged him to check BP regularly at home.  If SBP remains >140 mmHg, will consider increasing diltiazem to 240 mg daily. He has f/u w/PCP Dr. Koleen Nimrod in 3 months.  Hyperlipidemia:  Chol 130, TG 128, HDL 38, LDL 69 (12/2021). Continue rosuvastatin.     F/u in 6 months   [employer], PA-C 06/26/2022, 9:55 AM Office: [phone]

## 2022-07-21 ENCOUNTER — Encounter: Payer: Self-pay | Admitting: Cardiology

## 2022-07-23 NOTE — Telephone Encounter (Signed)
From patient

## 2022-08-06 ENCOUNTER — Other Ambulatory Visit: Payer: Self-pay | Admitting: Cardiology

## 2022-08-22 ENCOUNTER — Ambulatory Visit: Payer: 59 | Admitting: Cardiology

## 2022-08-30 ENCOUNTER — Ambulatory Visit: Payer: 59 | Admitting: Cardiology

## 2022-08-30 ENCOUNTER — Encounter: Payer: Self-pay | Admitting: Cardiology

## 2022-08-30 VITALS — BP 127/86 | HR 77 | Ht 72.0 in | Wt 232.0 lb

## 2022-08-30 DIAGNOSIS — I4819 Other persistent atrial fibrillation: Secondary | ICD-10-CM

## 2022-08-30 DIAGNOSIS — I10 Essential (primary) hypertension: Secondary | ICD-10-CM

## 2022-08-30 MED ORDER — DILTIAZEM HCL ER COATED BEADS 240 MG PO CP24: ORAL_CAPSULE | ORAL | 3 refills | Status: DC

## 2022-08-30 NOTE — Progress Notes (Signed)
Patient referred by Emilio Aspen, * for atrial fibrillation  Subjective:   Dylan Wilkerson, male    DOB: 11-Oct-1960, 62 y.o.   MRN: [id number]   Chief Complaint  Patient presents with   Atrial Fibrillation   Hypertension     HPI  62 y.o. Caucasian male with hypertension, paroxysmal atrial fibrillation, now with recovered EF, possible episode of TIA 12/2020  Blood pressure has been up-and-down.  Patient has no symptoms.  Average blood pressure on home monitor reads 142/89 mmHg.  Current Outpatient Medications:    Bioflavonoid Products (ESTER-C) TABS, Take 1 tablet by mouth daily. , Disp: , Rfl:    brimonidine-timolol (COMBIGAN) 0.2-0.5 % ophthalmic solution, Place 1 drop into both eyes 2 (two) times daily. , Disp: , Rfl:    Cholecalciferol (VITAMIN D-3) 125 MCG (5000 UT) TABS, Take 5,000 Units by mouth daily at 12 noon., Disp: , Rfl:    clobetasol ointment (TEMOVATE) 0.05 %, Apply 1 application topically 2 (two) times daily as needed (skin irritation.). , Disp: , Rfl:    diltiazem (CARDIZEM CD) 120 MG 24 hr capsule, TAKE 1 CAPSULE(120 MG) BY MOUTH DAILY, Disp: 90 capsule, Rfl: 0   diphenhydrAMINE (BENADRYL) 25 MG tablet, Take 25 mg by mouth 3 (three) times daily as needed for allergies. , Disp: , Rfl:    ELIQUIS 5 MG TABS tablet, TAKE 1 TABLET(5 MG) BY MOUTH TWICE DAILY, Disp: 60 tablet, Rfl: 3   lisinopril (ZESTRIL) 40 MG tablet, TAKE 1 TABLET(40 MG) BY MOUTH DAILY, Disp: 90 tablet, Rfl: 3   loratadine (CLARITIN) 10 MG tablet, Take 10 mg by mouth daily., Disp: , Rfl:    Misc Natural Products (GLUCOSAMINE CHOND MSM FORMULA) TABS, Take 1 tablet by mouth daily at 12 noon. , Disp: , Rfl:    Multiple Vitamin (MULTIVITAMIN WITH MINERALS) TABS tablet, Take 1 tablet by mouth daily at 12 noon., Disp: , Rfl:    Multiple Vitamins-Minerals (PRESERVISION AREDS 2 PO), Take 1 tablet by mouth in the morning and at bedtime. , Disp: , Rfl:    Omega 3-6-9 Fatty Acids (OMEGA 3-6-9  COMPLEX) CAPS, Take 1 tablet by mouth daily at 12 noon. , Disp: , Rfl:    rosuvastatin (CRESTOR) 10 MG tablet, rosuvastatin 10 mg tablet, Disp: , Rfl:    Saw Palmetto 450 MG CAPS, Take 450 mg by mouth daily at 12 noon. , Disp: , Rfl:    triamcinolone cream (KENALOG) 0.1 %, Apply 1 application topically as needed (Hand for eczema). Hand for eczema, Disp: , Rfl:    Zinc 50 MG TABS, Take 50 mg by mouth daily at 12 noon., Disp: , Rfl:   Cardiovascular and other pertinent studies:  EKG 06/26/2022: Atrial fibrillation 112 bpm   Exercise Tetrofosmin stress test 01/15/2021: Exercise nuclear stress test was performed using Bruce protocol. Patient reached 4.2 METS, and 92% of age predicted maximum heart rate. Exercise capacity was low. No chest pain reported. Heart rate and hemodynamic response were normal. Peak EKG demonstrated atrial fibrillation with rapid ventricular rate, no significant ST-T changes. Normal myocardial perfusion. Stress LVEF 60%. Low risk study.   CTA head/neck 01/26/2021: 1. Minimal atherosclerosis without a large or medium vessel occlusion or significant stenosis in the head or neck. 2. Unremarkable CT appearance of the brain. 3. 2 mm left upper lobe lung nodule. No follow-up needed if patient is low-risk. Non-contrast chest CT can be considered in 12 months if patient is high-risk. This recommendation follows the consensus  statement: Guidelines for Management of Incidental Pulmonary Nodules Detected on CT Images: From the Fleischner Society 2017; Radiology 2017; 284:228-243.   MRI brian 01/23/2021: Normal  Echocardiogram 06/27/2020:  Normal LV systolic function with visual EF 50-55%. Left ventricle cavity  is normal in size. Normal global wall motion. Unable to evaluate diastolic  function due to atrial fibrillation. Calculated EF 51%.  Moderate (Grade II) aortic regurgitation.  Moderate (Grade II) mitral regurgitation.  Mild tricuspid regurgitation. No evidence of pulmonary  hypertension.  Mild pulmonic regurgitation.  Compared to prior study dated 10/05/2019: Unable to quantify diastolic  dysfunction due to atrial fibrillation. AR and PR are stable. Mild MR is  now Moderate MR.   Unsuccessful cardioversion attempt 03/14/2020  Direct current cardioversion 07/27/2019: Indication symptomatic A. Fibrillation. Procedure: Using 130 mg of IV Propofol and 50 IV Lidocaine (for reducing venous pain) for achieving deep sedation, synchronized direct current cardioversion performed. Patient was delivered with 1560x1, 200 Joules of electricity X 3 with success to NSR. Patient tolerated the procedure well. No immediate complication noted.  Also received 5 mg lopressor IV prior to procedure.     Recent labs: 01/07/2022: Glucose 95, BUN/Cr 11/0.83. EGFR 99. Na/K 143/4.1.  Chol 130, TG 128, HDL 38, LDL 69  01/09/2021: BUN 14, creatinine 0.87, GFR >60, sodium 142, potassium 4.3 Total cholesterol 182, triglycerides 158, HDL 39, LDL 115  03/08/2020: Glucose 73, BUN/Cr 12/0.79. EGFR 98. Na/K 141/4.6.   11/19/2019: Glucose 84, BUN/Cr 17/0.76. EGFR 99. Na/K 140/4.4.    04/19/2019: Glucose 83, BUN/Cr 13/0.78. EGFR normal. Na/K 140/4.2. Rest of the CMP normal H/H 15/43. MCV 91. Platelets 203    Review of Systems  Cardiovascular:  Negative for chest pain, dyspnea on exertion, leg swelling, palpitations and syncope.       Vitals:   08/30/22 1405  BP: 127/86  Pulse: 77  SpO2: 98%     Body mass index is 31.46 kg/m. Filed Weights   08/30/22 1405  Weight: 232 lb (105.2 kg)     Objective:   Physical Exam Vitals reviewed.  Constitutional:      General: He is not in acute distress. Neck:     Vascular: No JVD.  Cardiovascular:     Rate and Rhythm: Normal rate. Rhythm irregular.     Heart sounds: Normal heart sounds. No murmur heard. Pulmonary:     Effort: Pulmonary effort is normal.     Breath sounds: Normal breath sounds.  Musculoskeletal:     Right lower  leg: No edema.     Left lower leg: No edema.         Assessment & Recommendations:    62 y.o. Caucasian male with hypertension, paroxysmal atrial fibrillation, now with recovered EF, possible episode of TIA 12/2020  Persistent atrial fibrillation: Unsuccessful cardioversion attempt on 03/14/2020. Patient remains asymptomatic and is well rate controlled per EKG Given prior history of heart failure and that he is only 60 options regarding antiarrhythmic therapy are limited to Tikosyn and amiodarone.  Patient does not wish to have hospitalization for Tikosyn initiation and he is young, therefore would recommend avoiding amiodarone.  Shared decision has been to continue rate control strategy He continues to tolerate anticoagulation without bleeding diathesis. CHA2DS2-VASc score is 2, given prior history of heart failure, with annual stroke risk for 2%.  Continue Eliquis 5 mg twice daily.  Continue diltiazem   Hypertension: Average pressure pressure 142/89 mmHg at home. Increased diltiazem to 240 mg daily. Continue lisinopril 40 mg daily.  Hyperlipidemia:  Chol 130, TG 128, HDL 38, LDL 69 (12/2021). Continue rosuvastatin.     F/u in 3 months   Marsha Hillman Emiliano Dyer, PA-C 08/30/2022, 2:11 PM Office: [phone]

## 2022-09-24 ENCOUNTER — Encounter: Payer: Self-pay | Admitting: Cardiology

## 2022-09-28 ENCOUNTER — Other Ambulatory Visit: Payer: Self-pay | Admitting: Cardiology

## 2022-09-28 DIAGNOSIS — I4819 Other persistent atrial fibrillation: Secondary | ICD-10-CM

## 2022-12-18 ENCOUNTER — Ambulatory Visit: Payer: 59 | Admitting: Cardiology

## 2022-12-25 ENCOUNTER — Ambulatory Visit: Payer: 59 | Admitting: Cardiology

## 2023-01-19 ENCOUNTER — Other Ambulatory Visit: Payer: Self-pay | Admitting: Cardiology

## 2023-01-19 DIAGNOSIS — I4819 Other persistent atrial fibrillation: Secondary | ICD-10-CM

## 2023-01-22 ENCOUNTER — Ambulatory Visit: Payer: 59 | Admitting: Cardiology

## 2023-01-22 ENCOUNTER — Encounter: Payer: Self-pay | Admitting: Cardiology

## 2023-01-22 VITALS — BP 114/89 | HR 90 | Resp 16 | Ht 72.0 in | Wt 229.0 lb

## 2023-01-22 DIAGNOSIS — E782 Mixed hyperlipidemia: Secondary | ICD-10-CM

## 2023-01-22 DIAGNOSIS — I10 Essential (primary) hypertension: Secondary | ICD-10-CM

## 2023-01-22 DIAGNOSIS — R600 Localized edema: Secondary | ICD-10-CM

## 2023-01-22 DIAGNOSIS — I4819 Other persistent atrial fibrillation: Secondary | ICD-10-CM

## 2023-01-22 MED ORDER — FUROSEMIDE 20 MG PO TABS: 20.0000 mg | ORAL_TABLET | Freq: Every day | ORAL | 3 refills | Status: DC

## 2023-01-22 NOTE — Progress Notes (Signed)
Patient referred by Emilio Aspen, * for atrial fibrillation  Subjective:   Dylan Wilkerson, male    DOB: 1960-09-02, 62 y.o.   MRN: [id number]   Chief Complaint  Patient presents with   Atrial Fibrillation   Follow-up     HPI  62 y.o. Caucasian male with hypertension, paroxysmal atrial fibrillation, now with recovered EF, possible episode of TIA 12/2020  Patient continues to be active, denies any chest pain, dyspnea. He himself has not noted any leg edema.    Current Outpatient Medications:    Bioflavonoid Products (ESTER-C) TABS, Take 1 tablet by mouth daily. 1000mg , Disp: , Rfl:    brimonidine-timolol (COMBIGAN) 0.2-0.5 % ophthalmic solution, Place 1 drop into both eyes 2 (two) times daily. , Disp: , Rfl:    Cholecalciferol (VITAMIN D-3) 125 MCG (5000 UT) TABS, Take 5,000 Units by mouth daily at 12 noon., Disp: , Rfl:    clobetasol ointment (TEMOVATE) 0.05 %, Apply 1 application topically 2 (two) times daily as needed (skin irritation.). , Disp: , Rfl:    diltiazem (CARDIZEM CD) 240 MG 24 hr capsule, Daily, Disp: 90 capsule, Rfl: 3   diphenhydrAMINE (BENADRYL) 25 MG tablet, Take 25 mg by mouth 3 (three) times daily as needed for allergies. , Disp: , Rfl:    ELIQUIS 5 MG TABS tablet, TAKE 1 TABLET(5 MG) BY MOUTH TWICE DAILY, Disp: 60 tablet, Rfl: 3   lisinopril (ZESTRIL) 40 MG tablet, TAKE 1 TABLET(40 MG) BY MOUTH DAILY, Disp: 90 tablet, Rfl: 3   loratadine (CLARITIN) 10 MG tablet, Take 10 mg by mouth daily., Disp: , Rfl:    Misc Natural Products (GLUCOSAMINE CHOND MSM FORMULA) TABS, Take 1 tablet by mouth daily at 12 noon. , Disp: , Rfl:    Multiple Vitamin (MULTIVITAMIN WITH MINERALS) TABS tablet, Take 1 tablet by mouth daily at 12 noon., Disp: , Rfl:    Multiple Vitamins-Minerals (PRESERVISION AREDS 2 PO), Take 1 tablet by mouth in the morning and at bedtime. , Disp: , Rfl:    Omega 3-6-9 Fatty Acids (OMEGA 3-6-9 COMPLEX) CAPS, Take 1 tablet by mouth daily at 12  noon. , Disp: , Rfl:    rosuvastatin (CRESTOR) 10 MG tablet, rosuvastatin 10 mg tablet, Disp: , Rfl:    Saw Palmetto 450 MG CAPS, Take 450 mg by mouth daily at 12 noon. , Disp: , Rfl:    triamcinolone cream (KENALOG) 0.1 %, Apply 1 application topically as needed (Hand for eczema). Hand for eczema, Disp: , Rfl:    Zinc 50 MG TABS, Take 50 mg by mouth daily at 12 noon., Disp: , Rfl:   Cardiovascular and other pertinent studies:  EKG 01/22/2023: Atrial fibrillation 89 bpm  Exercise Tetrofosmin stress test 01/15/2021: Exercise nuclear stress test was performed using Bruce protocol. Patient reached 4.2 METS, and 92% of age predicted maximum heart rate. Exercise capacity was low. No chest pain reported. Heart rate and hemodynamic response were normal. Peak EKG demonstrated atrial fibrillation with rapid ventricular rate, no significant ST-T changes. Normal myocardial perfusion. Stress LVEF 60%. Low risk study.   CTA head/neck 01/26/2021: 1. Minimal atherosclerosis without a large or medium vessel occlusion or significant stenosis in the head or neck. 2. Unremarkable CT appearance of the brain. 3. 2 mm left upper lobe lung nodule. No follow-up needed if patient is low-risk. Non-contrast chest CT can be considered in 12 months if patient is high-risk. This recommendation follows the consensus statement: Guidelines for Management of Incidental Pulmonary  Nodules Detected on CT Images: From the Fleischner Society 2017; Radiology 2017; [id number].   MRI brian 01/23/2021: Normal  Echocardiogram 06/27/2020:  Normal LV systolic function with visual EF 50-55%. Left ventricle cavity  is normal in size. Normal global wall motion. Unable to evaluate diastolic  function due to atrial fibrillation. Calculated EF 51%.  Moderate (Grade II) aortic regurgitation.  Moderate (Grade II) mitral regurgitation.  Mild tricuspid regurgitation. No evidence of pulmonary hypertension.  Mild pulmonic regurgitation.   Compared to prior study dated 10/05/2019: Unable to quantify diastolic  dysfunction due to atrial fibrillation. AR and PR are stable. Mild MR is  now Moderate MR.   Unsuccessful cardioversion attempt 03/14/2020  Direct current cardioversion 07/27/2019: Indication symptomatic A. Fibrillation. Procedure: Using 130 mg of IV Propofol and 50 IV Lidocaine (for reducing venous pain) for achieving deep sedation, synchronized direct current cardioversion performed. Patient was delivered with 1560x1, 200 Joules of electricity X 3 with success to NSR. Patient tolerated the procedure well. No immediate complication noted.  Also received 5 mg lopressor IV prior to procedure.     Recent labs: 01/07/2022: Glucose 95, BUN/Cr 11/0.83. EGFR 99. Na/K 143/4.1.  Chol 130, TG 128, HDL 38, LDL 69  01/09/2021: BUN 14, creatinine 0.87, GFR >60, sodium 142, potassium 4.3 Total cholesterol 182, triglycerides 158, HDL 39, LDL 115  03/08/2020: Glucose 73, BUN/Cr 12/0.79. EGFR 98. Na/K 141/4.6.   11/19/2019: Glucose 84, BUN/Cr 17/0.76. EGFR 99. Na/K 140/4.4.    04/19/2019: Glucose 83, BUN/Cr 13/0.78. EGFR normal. Na/K 140/4.2. Rest of the CMP normal H/H 15/43. MCV 91. Platelets 203    Review of Systems  Cardiovascular:  Negative for chest pain, dyspnea on exertion, leg swelling, palpitations and syncope.       Vitals:   01/22/23 1426  BP: 114/89  Pulse: 90  Resp: 16  SpO2: 97%      Body mass index is 31.06 kg/m. Filed Weights   01/22/23 1426  Weight: 229 lb (103.9 kg)      Objective:   Physical Exam Vitals and nursing note reviewed.  Constitutional:      General: He is not in acute distress. Neck:     Vascular: No JVD.  Cardiovascular:     Rate and Rhythm: Normal rate. Rhythm irregular.     Heart sounds: Normal heart sounds. No murmur heard. Pulmonary:     Effort: Pulmonary effort is normal.     Breath sounds: Normal breath sounds. No wheezing or rales.  Musculoskeletal:      Right lower leg: Edema (1+) present.     Left lower leg: Edema (1+) present.         Assessment & Recommendations:    62 y.o. Caucasian male with hypertension, paroxysmal atrial fibrillation, now with recovered EF, possible episode of TIA 12/2020  Persistent atrial fibrillation: Unsuccessful cardioversion attempt on 03/14/2020. Patient remains asymptomatic and is well rate controlled per EKG Given prior history of heart failure, options regarding antiarrhythmic therapy are limited to Tikosyn and amiodarone.  Patient does not wish to have hospitalization for Tikosyn initiation and he is young, therefore would recommend avoiding amiodarone.  Shared decision has been to continue rate control strategy He continues to tolerate anticoagulation without bleeding diathesis. CHA2DS2-VASc score is 2, given prior history of heart failure, with annual stroke risk for 2%.  Continue Eliquis 5 mg twice daily.  Continue diltiazem   Given that he has more leg edema, recommend lasix 20 mg daily. Check echocardiogram and proBNP. Discussed low salt  diet. If EF is reduced, will discuss uptitrating GDMT for for possible HFrEF.  Hypertension: Well controlled.  Hyperlipidemia:  Chol 130, TG 128, HDL 38, LDL 69 (12/2021). Continue rosuvastatin.     F/u in 6-8 weeks    [employer], PA-C 01/22/2023, 11:39 AM Office: [phone]

## 2023-01-24 LAB — COMPREHENSIVE METABOLIC PANEL
ALT: 23 IU/L (ref 0–44)
AST: 23 IU/L (ref 0–40)
Albumin: 4.6 g/dL (ref 3.9–4.9)
Alkaline Phosphatase: 74 IU/L (ref 44–121)
BUN/Creatinine Ratio: 17 (ref 10–24)
BUN: 14 mg/dL (ref 8–27)
Bilirubin Total: 0.6 mg/dL (ref 0.0–1.2)
CO2: 28 mmol/L (ref 20–29)
Calcium: 10 mg/dL (ref 8.6–10.2)
Chloride: 99 mmol/L (ref 96–106)
Creatinine, Ser: 0.84 mg/dL (ref 0.76–1.27)
Globulin, Total: 2.4 g/dL (ref 1.5–4.5)
Glucose: 95 mg/dL (ref 70–99)
Potassium: 4.5 mmol/L (ref 3.5–5.2)
Sodium: 139 mmol/L (ref 134–144)
Total Protein: 7 g/dL (ref 6.0–8.5)
eGFR: 98 mL/min/{1.73_m2} (ref >59)

## 2023-01-24 LAB — LIPID PANEL
Chol/HDL Ratio: 3.9 ratio (ref 0.0–5.0)
Cholesterol, Total: 149 mg/dL (ref 100–199)
HDL: 38 mg/dL — ABNORMAL LOW (ref >39)
LDL Chol Calc (NIH): 72 mg/dL (ref 0–99)
Triglycerides: 238 mg/dL — ABNORMAL HIGH (ref 0–149)
VLDL Cholesterol Cal: 39 mg/dL (ref 5–40)

## 2023-01-24 LAB — PRO B NATRIURETIC PEPTIDE: NT-Pro BNP: 242 pg/mL — ABNORMAL HIGH (ref 0–210)

## 2023-03-04 ENCOUNTER — Ambulatory Visit (HOSPITAL_COMMUNITY): Payer: 59 | Attending: Cardiovascular Disease

## 2023-03-04 DIAGNOSIS — I4819 Other persistent atrial fibrillation: Secondary | ICD-10-CM | POA: Diagnosis not present

## 2023-03-04 DIAGNOSIS — R600 Localized edema: Secondary | ICD-10-CM | POA: Insufficient documentation

## 2023-03-04 LAB — ECHOCARDIOGRAM COMPLETE
Area-P 1/2: 4.68 cm2
MV M vel: 4.51 m/s
MV Peak grad: 81.2 mm[Hg]
P 1/2 time: 595 ms
Radius: 1.1 cm
S' Lateral: 3.9 cm

## 2023-03-05 ENCOUNTER — Ambulatory Visit: Payer: Self-pay | Admitting: Cardiology

## 2023-03-05 NOTE — Progress Notes (Signed)
Cardiology Office Note:  .   Date:  03/05/2023  ID:  Dylan Wilkerson, DOB [birth year], MRN [id number] PCP: Emilio Aspen, MD  [facility] Providers Cardiologist:  Truett Mainland, MD PCP: Emilio Aspen, MD  Chief Complaint  Patient presents with   Persistent atrial fibrillation    Atrial Fibrillation   Leg Swelling      History of Present Illness: Dylan Wilkerson Kitchen    Dylan Wilkerson is a 62 y.o. male with hypertension, paroxysmal atrial fibrillation, h/o HFrEF now with recovered EF, moderate to severe mitral regurgitation, possible episode of TIA 12/2020   Patient continues to feel clinically well.  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  He denies any complaints of chest pain, shortness of breath.  Expelling is very well-controlled with Lasix.  Reviewed recent echocardiogram results with the patient, details below.  Vitals:   03/07/23 1117  BP: 100/68  Pulse: 89  Resp: 16  SpO2: 98%     ROS:  Review of Systems  Cardiovascular:  Positive for leg swelling. Negative for chest pain, dyspnea on exertion, palpitations and syncope.     Studies Reviewed: Dylan Wilkerson Kitchen        EKG 03/08/2023:  Atrial fibrillation 76 bpm When compared with ECG of 01/22/2023, no significant change noted     Echocardiogram 03/04/2023: 1. Left ventricular ejection fraction, by estimation, is 50 to 55%. Left ventricular ejection fraction by 3D volume is 55 %. The left ventricle has low normal function. The left ventricle has no regional wall motion abnormalities. Left ventricular  diastolic parameters were normal.  2. Right ventricular systolic function is normal. The right ventricular size is normal. There is normal pulmonary artery systolic pressure.  3. Left atrial size was severely dilated.  4. Right atrial size was moderately dilated.  5. Mitral regurgitation is severe by PISA. Eccentric MR with Coanda effect. Consider TEE for  further evaluation. The mitral valve is normal in structure. Moderate to severe mitral valve regurgitation. No evidence of mitral stenosis.  6. The aortic valve is tricuspid. Aortic valve regurgitation is moderate. No aortic stenosis is present. Aortic regurgitation PHT measures 595 msec.  7. The inferior vena cava is dilated in size with >50% respiratory variability, suggesting right atrial pressure of 8 mmHg.  Risk Assessment/Calculations:    CHA2DS2-VASc Score = 4  This indicates a 4.8 % annual risk of stroke.         Physical Exam:   Physical Exam Vitals and nursing note reviewed.  Constitutional:      General: He is not in acute distress. Neck:     Vascular: No JVD.  Cardiovascular:     Rate and Rhythm: Normal rate. Rhythm irregular.     Heart sounds: Murmur heard.     High-pitched blowing holosystolic murmur is present with a grade of 3/6 at the apex.  Pulmonary:     Effort: Pulmonary effort is normal.     Breath sounds: Normal breath sounds. No wheezing or rales.  Musculoskeletal:     Right lower leg: Edema (Trace) present.     Left lower leg: Edema (Trace) present.      VISIT DIAGNOSES:   ICD-10-CM   1. Persistent atrial fibrillation (HCC)  I48.19 EKG 12-Lead    2. Nonrheumatic mitral valve regurgitation  I34.0 Exercise Tolerance Test    ECHOCARDIOGRAM COMPLETE    Cardiac Stress Test: Informed Consent Details: Physician/Practitioner Attestation; Transcribe to consent form and obtain  patient signature       ASSESSMENT AND PLAN: .    Dylan Wilkerson is a 62 y.o. male with hypertension, paroxysmal atrial fibrillation, h/o HFrEF now with recovered EF, moderate to severe mitral regurgitation, possible episode of TIA 12/2020   Mitral regurgitation: Secondary to persistent A-fib and atrial dilatation. Moderate to severe eccentric mitral regurgitation noted on recent echocardiogram in 02/2023. His mitral regurgitation has progressively worsened over a period of  time.  However, he remains fairly asymptomatic other than mild leg edema. Recommend exercise treadmill stress test to evaluate exercise capacity.  If stress test is normal, will repeat echocardiogram in 6 months for monitoring of MR.  If stress test is abnormal, will recommend TEE and left and right heart catheterization.  Persistent A-fib: Prior attempts of cardioversion without subsidence of sinus rhythm. Rate is fairly well-controlled and he is fairly asymptomatic from A-fib itself. See below regarding mitral regurgitation that is primarily due to persistent A-fib. CHA2DS2-VASc Score = 4  This indicates a 4.8 % annual risk of stroke. Continue Eliquis 5 mg bid.  Informed Consent   Shared Decision Making/Informed Consent{ The risks [chest pain, shortness of breath, cardiac arrhythmias, dizziness, blood pressure fluctuations, myocardial infarction, stroke/transient ischemic attack, and life-threatening complications (estimated to be 1 in 10,000)], benefits (risk stratification, diagnosing coronary artery disease, treatment guidance) and alternatives of an exercise tolerance test were discussed in detail with Dylan Wilkerson and he agrees to proceed.       F/u in 6 months  Signed, Elder Negus, MD

## 2023-03-07 ENCOUNTER — Encounter: Payer: Self-pay | Admitting: Cardiology

## 2023-03-07 ENCOUNTER — Ambulatory Visit: Payer: 59 | Attending: Cardiology | Admitting: Cardiology

## 2023-03-07 VITALS — BP 100/68 | HR 89 | Resp 16 | Ht 72.0 in | Wt 227.0 lb

## 2023-03-07 DIAGNOSIS — I340 Nonrheumatic mitral (valve) insufficiency: Secondary | ICD-10-CM

## 2023-03-07 DIAGNOSIS — I4819 Other persistent atrial fibrillation: Secondary | ICD-10-CM | POA: Diagnosis not present

## 2023-03-07 NOTE — Patient Instructions (Signed)
Medication Instructions:   Your physician recommends that you continue on your current medications as directed. Please refer to the Current Medication list given to you today.  *If you need a refill on your cardiac medications before your next appointment, please call your pharmacy*   Testing/Procedures:  Your physician has requested that you have an exercise tolerance test. For further information please visit [email or web address] Please also follow instruction sheet, as given.   Your physician has requested that you have an echocardiogram. Echocardiography is a painless test that uses sound waves to create images of your heart. It provides your doctor with information about the size and shape of your heart and how well your heart's chambers and valves are working. This procedure takes approximately one hour. There are no restrictions for this procedure.  SCHEDULE ECHO TO BE DONE IN APRIL 2025  Please do NOT wear cologne, perfume, aftershave, or lotions (deodorant is allowed). Please arrive 15 minutes prior to your appointment time.    Follow-Up:  IN APRIL 2025 WITH DR. PATWARDHAN --AFTER YOU HAVE YOUR ECHO DONE

## 2023-03-27 ENCOUNTER — Ambulatory Visit: Payer: 59 | Attending: Cardiology

## 2023-03-27 DIAGNOSIS — I340 Nonrheumatic mitral (valve) insufficiency: Secondary | ICD-10-CM

## 2023-03-27 LAB — EXERCISE TOLERANCE TEST
Angina Index: 0
Duke Treadmill Score: 3
Estimated workload: 4.6
Exercise duration (min): 2 min
Exercise duration (sec): 33 s
MPHR: 156 {beats}/min
Peak HR: 179 {beats}/min
Percent HR: 114 %
RPE: 16
Rest HR: 99 {beats}/min
ST Depression (mm): 0 mm

## 2023-04-02 NOTE — Progress Notes (Signed)
Please se the message below for the patient. If he agrees, pease order transthoracic echcoardiogram for mitral regurgitation to be done in march 2025.  Thanks MJP   I gather your stress test was topped early due to rapid heart beat. This was likely due to Afib, not necessarily due to your exercise capacity being low. Your valve itself is normal, but it is leaking due to dilated atrium due to Afib. I thin it is reasonable to monitor this with repeat echocardiogram in 6 months. If there is any new symptoms between now and then, we will then consider performing TEE.   Thanks MJP

## 2023-04-17 ENCOUNTER — Other Ambulatory Visit: Payer: Self-pay | Admitting: Cardiology

## 2023-04-17 DIAGNOSIS — I4819 Other persistent atrial fibrillation: Secondary | ICD-10-CM

## 2023-04-21 NOTE — Telephone Encounter (Signed)
Prescription refill request for Eliquis received. Indication: Afib  Last office visit: 03/07/23 (Patwardhan)  Scr: 0.84 (01/23/23)  Age: 62 Weight: 103kg  Appropriate dose. Refill sent.

## 2023-05-16 ENCOUNTER — Other Ambulatory Visit: Payer: Self-pay | Admitting: Cardiology

## 2023-05-19 ENCOUNTER — Other Ambulatory Visit: Payer: Self-pay | Admitting: Cardiology

## 2023-05-19 DIAGNOSIS — I10 Essential (primary) hypertension: Secondary | ICD-10-CM

## 2023-08-18 ENCOUNTER — Encounter: Payer: Self-pay | Admitting: *Deleted

## 2023-08-21 ENCOUNTER — Ambulatory Visit (HOSPITAL_COMMUNITY): Payer: 59 | Attending: Cardiology

## 2023-08-21 ENCOUNTER — Encounter: Payer: Self-pay | Admitting: Cardiology

## 2023-08-21 DIAGNOSIS — I340 Nonrheumatic mitral (valve) insufficiency: Secondary | ICD-10-CM | POA: Insufficient documentation

## 2023-08-21 LAB — ECHOCARDIOGRAM COMPLETE
Area-P 1/2: 4.37 cm2
MV M vel: 3.57 m/s
MV Peak grad: 51 mmHg
P 1/2 time: 668 ms
S' Lateral: 3.2 cm

## 2023-10-09 ENCOUNTER — Ambulatory Visit: Admitting: Neurology

## 2023-10-09 ENCOUNTER — Encounter: Payer: Self-pay | Admitting: Neurology

## 2023-10-09 VITALS — BP 105/71 | HR 103 | Ht 72.0 in | Wt 232.4 lb

## 2023-10-09 DIAGNOSIS — I4819 Other persistent atrial fibrillation: Secondary | ICD-10-CM

## 2023-10-09 DIAGNOSIS — G4733 Obstructive sleep apnea (adult) (pediatric): Secondary | ICD-10-CM | POA: Diagnosis not present

## 2023-10-09 NOTE — Patient Instructions (Signed)
Please continue using your CPAP regularly. While your insurance requires that you use CPAP at least 4 hours each night on 70% of the nights, I recommend, that you not skip any nights and use it throughout the night if you can. Getting used to CPAP and staying with the treatment long term does take time and patience and discipline. Untreated obstructive sleep apnea when it is moderate to severe can have an adverse impact on cardiovascular health and raise her risk for heart disease, arrhythmias, hypertension, congestive heart failure, stroke and diabetes. Untreated obstructive sleep apnea causes sleep disruption, nonrestorative sleep, and sleep deprivation. This can have an impact on your day to day functioning and cause daytime sleepiness and impairment of cognitive function, memory loss, mood disturbance, and problems focussing. Using CPAP regularly can improve these symptoms. ° ° °

## 2023-10-09 NOTE — Progress Notes (Signed)
 Subjective:    Patient ID: Dylan Wilkerson is a 63 y.o. male.  HPI    Interim history:   Dylan Wilkerson is a 63 year old right-handed gentleman with an underlying medical history of seasonal allergies, kidney lesion, eczema, A. fib with status post cardioversion, overweight state, and Hx of TIA, who presents for follow-up consultation of his obstructive apnea, well-established on home.  The patient is unaccompanied today.  He presents after a gap of nearly 20 years.  He was last seen in our clinic by Clem Currier, NP, in June 2023, at which time he was compliant with his AutoPap and doing well.  Today, 10/09/2023: I reviewed his AutoPap compliance data from 09/08/2023 through 10/07/2023, which is a total of 30 days, during which time he used his machine every night with percent use days greater than 4 hours at 100%, indicating superb compliance with an average usage of 7 hours and 46 minutes, residual AHI at goal at 4.4/h, average pressure for the 95th percentile at 11.7 cm with a range of 6 to 12 cm with EPR of 3.  Leak acceptable with some fluctuation, 95th percentile at 20.5 L/min.  He reports overall doing well with his AutoPap, he is compliant with treatment and continues to benefit from it.  He tolerates the nasal mask.  He does travel quite a bit but usually does not go without treatment except for 1 night here and there.  He has some questions about inspire, was curious as to how it works.  We talked about it.  He has switched to a new PCP since his previous PCP retired.  He is in regular follow-up with Dr. Filiberto Hug with cardiology due to his history of persistent A-fib and severe mitral regurgitation. Set up date was 05/16/2020. The patient's allergies, current medications, family history, past medical history, past social history, past surgical history and problem list were reviewed and updated as appropriate.    Previously:       10/30/2021 Clem Currier, NP): <<Mr. Dylan Wilkerson is a  63 year old male with a history of OSA on CPAP. She returns today for follow-up. No new issues with CPAP. Reports that he has not had any additional stroke like symptoms. >>   01/08/2021 (SA): (He) presents for a new problem visit for suspected TIA.  He was advised to make an appointment by his cardiologist.  Patient reports that he had an episode of transient numbness affecting his left arm and his left leg, not his face.  He had done yoga that morning as he usually does.  He has been walking on a regular basis and took his walk as well, walked about 2-1/2 miles.  He went in the shower and as he exited the shower stall, he noticed that he was not moving as easily on the left side, did not frankly feel weak, could still walk okay but felt just a little different and had a numbness affecting his left arm and leg which lasted about 30 minutes.  He called his cardiologist after the weekend.  He did not have any other symptoms such as slurring of speech or headache.  He called his mom and told her that he was not feeling well but did not sound garbled.  He has since then talked to some of his friends that work in healthcare and everybody advised him to have it checked out.  He did not think of going to the ER and decided not to go to the ED as he  felt well since then and before then.  He is a non-smoker, he does not indulge in alcohol on a regular basis.  Risk factors for stroke include history of A. fib, obesity, and sleep apnea, he is compliant with his AutoPap and doing well in that regard.  He tries to hydrate well with water.  He has no family history of stroke or A. fib.  He is feeling well and at baseline currently.  He is scheduled for a Myoview test next week and has a CT angiogram of the head and neck scheduled for early September.   I last saw him on 07/03/2020, at which time he was compliant with his new AutoPap machine.  He had adjusted well to treatment and was working on weight loss.   He has a CT  angiogram of the head and neck scheduled for 01/26/2021.  He saw his cardiologist, Dr. Filiberto Hug on 12/20/2020.  He called his cardiology office on 01/02/2021 with sudden onset of left-sided numbness that lasted for about 30 to 40 minutes.  When I reviewed the chart on 01/03/2021, the patient was advised to proceed to the emergency room immediately.  The patient did not go to the ER.      I first met him on 09/07/2018, at the  request of his cardiologist, at which time he reported a prior diagnosis of sleep apnea.  He had been on CPAP therapy but had lost weight.  He was encouraged to seek reevaluation.  He had a home sleep test on 03/20/2020 which indicated moderate obstructive sleep apnea by number of events with an AHI of 27.7/h, O2 nadir was 90%.  Given his history of A. fib he was advised to start AutoPap therapy. Set up date was 05/16/2020.   I reviewed his AutoPap compliance data from 05/30/2020 through 06/28/2020, which is a total of 30 days, during which time he used his machine every night with percent use days greater than 4 hours at 100%, indicating superb compliance with an average usage of 8 hours and 9 minutes, residual AHI at goal at 2.2/h, average pressure for the 95th percentile at 10.6 cm with a range of 6 to 12 cm with EPR, leak on the high side lately especially in the past 10 days with a 95th percentile at 24.2 L/min.      09/07/19: (He) reports a prior diagnosis of obstructive sleep apnea several years ago.  Sleep study testing was through ENT, Dr. Archer Kobs at the time, probably 15 or 20 years ago he recalls.  As he recalls, he had significant sleep apnea at the time and was on a CPAP machine but was able to lose quite a bit of weight.  He no longer uses CPAP therapy but has not had a sleep study in years.  He is single, lives alone, does not know if snores currently.  His Epworth sleepiness score is 1 out of 24, fatigue severity score is 16 out of 63.  He does not have a family history of sleep  apnea.  He tries to stay active, walks about 5 miles per day, has worked on stress reduction and practices yoga on a regular basis.  He has never had his tonsils out. I reviewed your office note from 08/06/2019.  He has reduced his caffeine intake since his A. fib diagnosis.  He goes to bed between 10 and 11 and rise time is currently around 8.  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  He denies any morning headaches.  He has nocturia about once per average night.  He has no pets in the house.  He has a TV in the bedroom but turns it off before falling asleep.  He is a non-smoker and drinks alcohol occasionally.   His Past Medical History Is Significant For: Past Medical History:  Diagnosis Date   A-fib (HCC)    Cardiomyopathy (HCC)    Eczema    Kidney lesion    "has been cleared"   Seasonal allergies     His Past Surgical History Is Significant For: Past Surgical History:  Procedure Laterality Date   CARDIOVERSION N/A 07/27/2019   Procedure: CARDIOVERSION;  Surgeon: Knox Perl, MD;  Location: [facility] ENDOSCOPY;  Service: Cardiovascular;  Laterality: N/A;   CARDIOVERSION N/A 03/14/2020   Procedure: CARDIOVERSION;  Surgeon: Cody Das, MD;  Location: [facility] ENDOSCOPY;  Service: Cardiovascular;  Laterality: N/A;   CATARACT EXTRACTION Bilateral    age 61 right eye, age 3 lft eye    His Family History Is Significant For: Family History  Problem Relation Age of Onset   Dementia Father    Prostate cancer Father    Sleep apnea Neg Hx     His Social History Is Significant For: Social History   Socioeconomic History   Marital status: Single    Spouse name: Not on file   Number of children: 0   Years of education: Not on file   Highest education level: Not on file  Occupational History   Not on file  Tobacco Use   Smoking status: Never   Smokeless tobacco: Never  Vaping Use    Vaping status: Never Used  Substance and Sexual Activity   Alcohol use: Yes    Comment: occ   Drug use: Never   Sexual activity: Not on file  Other Topics Concern   Not on file  Social History Narrative   Pt work   Pt lives alone    Social Drivers of Health   Financial Resource Strain: Not on file  Food Insecurity: Not on file  Transportation Needs: Not on file  Physical Activity: Not on file  Stress: Not on file  Social Connections: Unknown (04/03/2022)   [facility] from [facility]   Social Network    Social Network: Not on file    His Allergies Are:  Allergies  Allergen Reactions   Dairy Aid [Tilactase] Other (See Comments)    Gi upset  :   His Current Medications Are:  Outpatient Encounter Medications as of 10/09/2023  Medication Sig   apixaban  (ELIQUIS ) 5 MG TABS tablet TAKE 1 TABLET(5 MG) BY MOUTH TWICE DAILY   Bioflavonoid Products (ESTER-C) TABS Take 1 tablet by mouth daily. 1000mg    brimonidine-timolol (COMBIGAN) 0.2-0.5 % ophthalmic solution Place 1 drop into both eyes 2 (two) times daily.    Cholecalciferol (VITAMIN D-3) 125 MCG (5000 UT) TABS Take 5,000 Units by mouth daily at 12 noon.   clobetasol ointment (TEMOVATE) 0.05 % Apply 1 application topically 2 (two) times daily as needed (skin irritation.).    diltiazem  (CARDIZEM  CD) 240 MG 24 hr capsule TAKE 1 CAPSULE BY MOUTH EVERY DAY.   diphenhydrAMINE (BENADRYL) 25 MG tablet Take 25 mg by mouth 3 (three) times daily as needed for allergies.    lisinopril  (ZESTRIL ) 40 MG tablet TAKE 1 TABLET(40 MG) BY MOUTH DAILY  loratadine (CLARITIN) 10 MG tablet Take 10 mg by mouth daily.   Misc Natural Products (GLUCOSAMINE CHOND MSM FORMULA) TABS Take 1 tablet by mouth daily at 12 noon.    Multiple Vitamin (MULTIVITAMIN WITH MINERALS) TABS tablet Take 1 tablet by mouth daily at 12 noon.   Multiple Vitamins-Minerals (PRESERVISION AREDS 2 PO) Take 1 tablet by mouth in the morning and at bedtime.    Omega  3-6-9 Fatty Acids (OMEGA 3-6-9 COMPLEX) CAPS Take 1 tablet by mouth daily at 12 noon.    rosuvastatin (CRESTOR) 10 MG tablet rosuvastatin 10 mg tablet   Saw Palmetto 450 MG CAPS Take 450 mg by mouth daily at 12 noon.    triamcinolone cream (KENALOG) 0.1 % Apply 1 application topically as needed (Hand for eczema). Hand for eczema   Zinc 50 MG TABS Take 50 mg by mouth daily at 12 noon.   furosemide  (LASIX ) 20 MG tablet Take 1 tablet (20 mg total) by mouth daily.   No facility-administered encounter medications on file as of 10/09/2023.  :  Review of Systems:  Out of a complete 14 point review of systems, all are reviewed and negative with the exception of these symptoms as listed below:  Review of Systems  Neurological:        Pt here for cpap f/u  Pt wants to discuss inspire. Pt states travels a lot for work  Pt states he has AFIB  ESS:0 FSS:9     Objective:  Neurological Exam  Physical Exam Physical Examination:   Vitals:   10/09/23 1016  BP: 105/71  Pulse: (!) 103    General Examination: The patient is a very pleasant 63 y.o. male in no acute distress. He appears well-developed and well-nourished and well groomed.   HEENT: Normocephalic, atraumatic, status post bilateral cataract repairs, corrective eyeglasses in place.  Pupils reactive to light.  Extraocular tracking is preserved, hearing is grossly intact, face is symmetric with normal facial animation, and normal facial sensation to light touch, temperature, vibration and pinprick.  Speech is clear with no dysarthria or hypophonia.  No lip, neck or jaw tremor.  Airway examination reveals moderate mouth dryness, tongue protrudes centrally in palate elevates symmetrically, small airway noted.  No carotid bruits.   Chest: Clear to auscultation without wheezing, rhonchi or crackles noted.   Heart: S1+S2+0, irregular.  Systolic murmur noted.   Abdomen: Soft, non-tender and non-distended.   Extremities: There is trace edema in  the distal legs.    Skin: Warm and dry without trophic changes noted.    Musculoskeletal: exam reveals no obvious joint deformities.    Neurologically:  Mental status: The patient is awake, alert and oriented in all 4 spheres. His immediate and remote memory, attention, language skills and fund of knowledge are appropriate. There is no evidence of aphasia, agnosia, apraxia or anomia. Speech is clear with normal prosody and enunciation. Thought process is linear. Mood is normal and affect is normal.  Cranial nerves II - XII are as described above under HEENT exam.  Motor exam: Normal bulk, strength and tone is noted. There is no drift, or tremor, fine motor skills and coordination: grossly intact.  Cerebellar testing: No dysmetria or intention tremor. There is no truncal or gait ataxia.  Finger-to-nose and heel-to-shin unremarkable bilaterally. Sensory exam: intact to light touch. Gait, station and balance: He stands easily. No veering to one side is noted. No leaning to one side is noted. Posture is age-appropriate and stance is narrow based. Gait  shows normal stride length and normal pace. No problems turning are noted.    Assessment and Plan:  In summary, DEVIAN BARTOLOMEI is a very pleasant 63 year old male with an underlying medical history of seasonal allergies, kidney lesion, eczema, A. fib, history of transient neurological symptoms in 2022 with TIA workup completed, and overweight state, who presents for follow-up consultation of his obstructive sleep apnea, well-established on home AutoPap therapy.  He continues to be compliant with treatment and benefits from it.  His apnea scores are at goal.  He is supposed to get new supplies.  I placed an order for supplies.  He is advised to follow-up routinely in this clinic to see one of our nurse practitioners in 1 year.  We talked about inspire a little bit today and I explained the procedure and the treatment to the patient and we mutually agreed  that he has been doing well on PAP therapy and should continue with it.  I answered all his questions today and he was in agreement with our plan.   I spent 30 minutes in total face-to-face time and in reviewing records during pre-charting, more than 50% of which was spent in counseling and coordination of care, reviewing test results, reviewing medications and treatment regimen and/or in discussing or reviewing the diagnosis of OSA, the prognosis and treatment options. Pertinent laboratory and imaging test results that were available during this visit with the patient were reviewed by me and considered in my medical decision making (see chart for details).

## 2023-12-03 ENCOUNTER — Other Ambulatory Visit: Payer: Self-pay | Admitting: Cardiology

## 2024-03-02 ENCOUNTER — Other Ambulatory Visit: Payer: Self-pay | Admitting: Cardiology

## 2024-03-04 ENCOUNTER — Telehealth: Payer: Self-pay | Admitting: Cardiology

## 2024-03-04 MED ORDER — FUROSEMIDE 20 MG PO TABS: 20.0000 mg | ORAL_TABLET | Freq: Every day | ORAL | 0 refills | Status: DC

## 2024-03-04 NOTE — Telephone Encounter (Signed)
*  STAT* If patient is at the pharmacy, call can be transferred to refill team.   1. Which medications need to be refilled? (please list name of each medication and dose if known) furosemide  (LASIX ) 20 MG tablet   2. Which pharmacy/location (including street and city if local pharmacy) is medication to be sent to? [id number] - [place], [place] - [address] AT [facility] OF [address] & [address]    3. Do they need a 30 day or 90 day supply?  30 day supply

## 2024-03-04 NOTE — Telephone Encounter (Signed)
 RX sent in

## 2024-03-18 ENCOUNTER — Other Ambulatory Visit: Payer: Self-pay

## 2024-03-18 DIAGNOSIS — I4819 Other persistent atrial fibrillation: Secondary | ICD-10-CM

## 2024-03-19 NOTE — Telephone Encounter (Signed)
 Prescription refill request for Eliquis  received. Indication: Afib Last office visit: 03/07/23 w pending appt 04/14/24 Scr: 0.84 Age: 63 Weight: 105.4kg  Please review due to last ov and last labs over 22yr

## 2024-03-23 ENCOUNTER — Other Ambulatory Visit: Payer: Self-pay | Admitting: Cardiology

## 2024-03-23 MED ORDER — APIXABAN 5 MG PO TABS: 5.0000 mg | ORAL_TABLET | Freq: Two times a day (BID) | ORAL | 0 refills | Status: DC

## 2024-03-25 MED ORDER — DILTIAZEM HCL ER COATED BEADS 240 MG PO CP24: 240.0000 mg | ORAL_CAPSULE | Freq: Every day | ORAL | 0 refills | Status: DC

## 2024-03-30 ENCOUNTER — Other Ambulatory Visit: Payer: Self-pay

## 2024-03-30 DIAGNOSIS — I10 Essential (primary) hypertension: Secondary | ICD-10-CM

## 2024-04-14 ENCOUNTER — Ambulatory Visit: Payer: Self-pay | Attending: Cardiology | Admitting: Cardiology

## 2024-04-14 ENCOUNTER — Encounter: Payer: Self-pay | Admitting: Cardiology

## 2024-04-14 VITALS — BP 105/74 | HR 95 | Ht 72.0 in | Wt 230.0 lb

## 2024-04-14 DIAGNOSIS — E782 Mixed hyperlipidemia: Secondary | ICD-10-CM | POA: Diagnosis present

## 2024-04-14 DIAGNOSIS — I340 Nonrheumatic mitral (valve) insufficiency: Secondary | ICD-10-CM | POA: Diagnosis present

## 2024-04-14 DIAGNOSIS — I4819 Other persistent atrial fibrillation: Secondary | ICD-10-CM | POA: Diagnosis present

## 2024-04-14 DIAGNOSIS — I10 Essential (primary) hypertension: Secondary | ICD-10-CM | POA: Diagnosis present

## 2024-04-14 MED ORDER — FUROSEMIDE 20 MG PO TABS: 20.0000 mg | ORAL_TABLET | Freq: Every day | ORAL | 3 refills | Status: AC

## 2024-04-14 MED ORDER — LISINOPRIL 40 MG PO TABS: ORAL_TABLET | ORAL | 3 refills | Status: AC

## 2024-04-14 MED ORDER — APIXABAN 5 MG PO TABS: 5.0000 mg | ORAL_TABLET | Freq: Two times a day (BID) | ORAL | 5 refills | Status: AC

## 2024-04-14 MED ORDER — DILTIAZEM HCL ER COATED BEADS 240 MG PO CP24
240.0000 mg | ORAL_CAPSULE | Freq: Every day | ORAL | 3 refills | Status: AC
Start: 2024-04-14 — End: ?

## 2024-04-14 MED ORDER — ROSUVASTATIN CALCIUM 20 MG PO TABS: 20.0000 mg | ORAL_TABLET | Freq: Every day | ORAL | 3 refills | Status: AC

## 2024-04-14 NOTE — Progress Notes (Signed)
 Cardiology Office Note:  .   Date:  04/14/2024  ID:  Dylan Wilkerson, DOB November 17, 1960, MRN [id number] PCP: Charlott Dorn LABOR, MD  [facility] Providers Cardiologist:  Newman Lawrence, MD PCP: Charlott Dorn LABOR, MD  Chief Complaint  Patient presents with   Atrial Fibrillation     Dylan Wilkerson is a 63 y.o. male with hypertension, paroxysmal atrial fibrillation, h/o HFrEF now with recovered EF, moderate to severe mitral regurgitation, possible episode of TIA 12/2020    Discussed the use of AI scribe software for clinical note transcription with the patient, who gave verbal consent to proceed.  History of Present Illness  Patient is doing well.  He stays active with regular walks, denies any complains of chest pain, shortness of breath, leg edema.  Reviewed recent echocardiogram results with patient, details below.     Vitals:   04/14/24 1401  BP: 105/74  Pulse: 95  SpO2: 98%      Review of Systems  Cardiovascular:  Negative for chest pain, dyspnea on exertion, leg swelling, palpitations and syncope.        Studies Reviewed: Dylan Wilkerson        EKG 04/14/2024: Atrial fibrillation 95 bpm Minimal voltage criteria for LVH, may be normal variant ( R in aVL ) When compared with ECG of 07-Mar-2023 12:07, QRS axis Shifted left T wave inversion now evident in Inferior leads     Echocardiogram 08/2023:  1. Left ventricular ejection fraction, by estimation, is 60 to 65%. The  left ventricle has normal function. The left ventricle has no regional  wall motion abnormalities. There is mild concentric left ventricular  hypertrophy. Left ventricular diastolic function could not be evaluated.   2. Right ventricular systolic function is normal. The right ventricular  size is mildly enlarged. There is normal pulmonary artery systolic  pressure. The estimated right ventricular systolic pressure is 25.5 mmHg.   3. Left atrial size was mildly dilated.   4. The mitral  valve is degenerative. Moderate mitral valve regurgitation.  No evidence of mitral stenosis.   5. The aortic valve is tricuspid. Aortic valve regurgitation is trivial.  Aortic valve sclerosis/calcification is present, without any evidence of  aortic stenosis. Aortic regurgitation PHT measures 668 msec.   6. Aortic dilatation noted. There is mild dilatation of the ascending  aorta, measuring 40 mm.   7. The inferior vena cava is normal in size with greater than 50%  respiratory variability, suggesting right atrial pressure of 3 mmHg.   Exercise treadmill stress test 03/2023: Functional status: Below average.  Chest pain: None.  Reason for stopping exercise: Target heart rate achieved.  Hypertensive response to exercise: No.  Heart rate response to exercise: Accelerated Exercise time 2 minutes 33 seconds on Bruce protocol, achieved 4.6 METS, 114% of age-predicted maximum heart rate  Stress ECG: Baseline artifact at peak stress, but no obvious ST deviations to suggest ischemia.  Low risk study. Clinical correlation required  Labs 09/2023: Chol 140, TG 133, HDL 40, LDL 77 Hb 15.1 TSH 1.8  01/2023: Cr 0.8   Risk Assessment/Calculations:    CHA2DS2-VASc Score = 5  This indicates a 7.2% annual risk of stroke. The patient's score is based upon: CHF History: 1 HTN History: 1 Diabetes History: 0 Stroke History: 2 Vascular Disease History: 0 Age Score: 1 Gender Score: 0      Physical Exam Vitals and nursing note reviewed.  Constitutional:      General: He is not in acute distress.  Neck:     Vascular: No JVD.  Cardiovascular:     Rate and Rhythm: Normal rate and regular rhythm.     Heart sounds: Murmur heard.     High-pitched blowing holosystolic murmur is present with a grade of 2/6 at the apex.  Pulmonary:     Effort: Pulmonary effort is normal.     Breath sounds: Normal breath sounds. No wheezing or rales.  Musculoskeletal:     Right lower leg: No edema.     Left  lower leg: No edema.      VISIT DIAGNOSES: No diagnosis found.   Dylan Wilkerson is a 63 y.o. male with hypertension, paroxysmal atrial fibrillation, h/o HFrEF now with recovered EF, moderate to severe mitral regurgitation, possible episode of TIA 12/2020     Assessment & Plan  Mitral regurgitation: Secondary to persistent A-fib and atrial dilatation. Moderate on recent echocardiogram. Patient is asymptomatic from this.   Repeat echocardiogram in 6 months   Persistent A-fib: Prior attempts of cardioversion without subsidence of sinus rhythm. Rate is fairly well-controlled and he is fairly asymptomatic from A-fib itself. Continue Eliquis  5 mg twice daily, diltiazem  240 mg daily. Blood pressure well-controlled lisinopril .  Mixed hyperlipidemia: LDL 77.  Increase Crestor  from 10 mg to 20 mg daily.      Meds ordered this encounter  Medications   rosuvastatin  (CRESTOR ) 20 MG tablet    Sig: Take 1 tablet (20 mg total) by mouth daily. rosuvastatin  10 mg tablet    Dispense:  90 tablet    Refill:  3   apixaban  (ELIQUIS ) 5 MG TABS tablet    Sig: Take 1 tablet (5 mg total) by mouth 2 (two) times daily. Please get updated labs at next appointment with Dr. Jerral    Dispense:  60 tablet    Refill:  5    30 day supply   diltiazem  (CARDIZEM  CD) 240 MG 24 hr capsule    Sig: Take 1 capsule (240 mg total) by mouth daily.    Dispense:  90 capsule    Refill:  3   furosemide  (LASIX ) 20 MG tablet    Sig: Take 1 tablet (20 mg total) by mouth daily.    Dispense:  90 tablet    Refill:  3   lisinopril  (ZESTRIL ) 40 MG tablet    Sig: TAKE 1 TABLET(40 MG) BY MOUTH DAILY    Dispense:  90 tablet    Refill:  3     F/u in 6 months  Signed, Newman JINNY Lawrence, MD

## 2024-04-14 NOTE — Patient Instructions (Signed)
 Medication Instructions:  INCREASE Crestor  to 20 mg daily   *If you need a refill on your cardiac medications before your next appointment, please call your pharmacy*  Lab Work IN 05/2024: Lipid panel  BMP CBC  If you have labs (blood work) drawn today and your tests are completely normal, you will receive your results only by: MyChart Message (if you have MyChart) OR A paper copy in the mail If you have any lab test that is abnormal or we need to change your treatment, we will call you to review the results.  Testing/Procedures: ECHOCARDIOGRAM IN 6 MONTHS  Your physician has requested that you have an echocardiogram. Echocardiography is a painless test that uses sound waves to create images of your heart. It provides your doctor with information about the size and shape of your heart and how well your heart's chambers and valves are working. This procedure takes approximately one hour. There are no restrictions for this procedure. Please do NOT wear cologne, perfume, aftershave, or lotions (deodorant is allowed). Please arrive 15 minutes prior to your appointment time.  Please note: We ask at that you not bring children with you during ultrasound (echo/ vascular) testing. Due to room size and safety concerns, children are not allowed in the ultrasound rooms during exams. Our front office staff cannot provide observation of children in our lobby area while testing is being conducted. An adult accompanying a patient to their appointment will only be allowed in the ultrasound room at the discretion of the ultrasound technician under special circumstances. We apologize for any inconvenience.   Follow-Up: At [facility], you and your health needs are our priority.  As part of our continuing mission to provide you with exceptional heart care, our providers are all part of one team.  This team includes your primary Cardiologist (physician) and Advanced Practice Providers or APPs  (Physician Assistants and Nurse Practitioners) who all work together to provide you with the care you need, when you need it.  Your next appointment:   6 month(s) AFTER ECHO   Provider:   Newman JINNY Lawrence, MD

## 2024-10-14 ENCOUNTER — Ambulatory Visit: Admitting: Adult Health

## 2024-10-21 ENCOUNTER — Ambulatory Visit (HOSPITAL_COMMUNITY)
